# Patient Record
Sex: Male | Born: 1955 | Race: White | Hispanic: No | Marital: Married
Health system: Southern US, Academic
[De-identification: ages and names within clinical notes are randomized; demographics above are authoritative.]

## PROBLEM LIST (undated history)

## (undated) DIAGNOSIS — R0683 Snoring: Secondary | ICD-10-CM

## (undated) DIAGNOSIS — C189 Malignant neoplasm of colon, unspecified: Secondary | ICD-10-CM

## (undated) DIAGNOSIS — K219 Gastro-esophageal reflux disease without esophagitis: Secondary | ICD-10-CM

## (undated) DIAGNOSIS — N529 Male erectile dysfunction, unspecified: Secondary | ICD-10-CM

## (undated) DIAGNOSIS — B49 Unspecified mycosis: Secondary | ICD-10-CM

## (undated) DIAGNOSIS — E119 Type 2 diabetes mellitus without complications: Secondary | ICD-10-CM

## (undated) DIAGNOSIS — M109 Gout, unspecified: Secondary | ICD-10-CM

## (undated) DIAGNOSIS — E559 Vitamin D deficiency, unspecified: Secondary | ICD-10-CM

## (undated) DIAGNOSIS — E785 Hyperlipidemia, unspecified: Secondary | ICD-10-CM

## (undated) DIAGNOSIS — G4700 Insomnia, unspecified: Secondary | ICD-10-CM

## (undated) DIAGNOSIS — F1010 Alcohol abuse, uncomplicated: Secondary | ICD-10-CM

## (undated) DIAGNOSIS — D649 Anemia, unspecified: Secondary | ICD-10-CM

## (undated) DIAGNOSIS — J449 Chronic obstructive pulmonary disease, unspecified: Secondary | ICD-10-CM

## (undated) DIAGNOSIS — E669 Obesity, unspecified: Secondary | ICD-10-CM

## (undated) DIAGNOSIS — I509 Heart failure, unspecified: Secondary | ICD-10-CM

## (undated) DIAGNOSIS — M1990 Unspecified osteoarthritis, unspecified site: Secondary | ICD-10-CM

## (undated) DIAGNOSIS — G629 Polyneuropathy, unspecified: Secondary | ICD-10-CM

## (undated) DIAGNOSIS — D696 Thrombocytopenia, unspecified: Secondary | ICD-10-CM

## (undated) DIAGNOSIS — F419 Anxiety disorder, unspecified: Secondary | ICD-10-CM

## (undated) DIAGNOSIS — C4491 Basal cell carcinoma of skin, unspecified: Secondary | ICD-10-CM

## (undated) HISTORY — PX: BOWEL RESECTION: SHX1257

## (undated) HISTORY — DX: Gastro-esophageal reflux disease without esophagitis: K21.9

## (undated) HISTORY — DX: Snoring: R06.83

## (undated) HISTORY — DX: Type 2 diabetes mellitus without complications: E11.9

## (undated) HISTORY — DX: Insomnia, unspecified: G47.00

## (undated) HISTORY — DX: Anemia, unspecified: D64.9

## (undated) HISTORY — DX: Gout, unspecified: M10.9

## (undated) HISTORY — DX: Polyneuropathy, unspecified: G62.9

## (undated) HISTORY — DX: Unspecified mycosis: B49

## (undated) HISTORY — DX: Alcohol abuse, uncomplicated: F10.10

## (undated) HISTORY — DX: Vitamin D deficiency, unspecified: E55.9

## (undated) HISTORY — DX: Obesity, unspecified: E66.9

## (undated) HISTORY — DX: Anxiety disorder, unspecified: F41.9

## (undated) HISTORY — DX: Unspecified osteoarthritis, unspecified site: M19.90

## (undated) HISTORY — DX: Malignant neoplasm of colon, unspecified: C18.9

## (undated) HISTORY — PX: COLONOSCOPY: SHX174

## (undated) HISTORY — DX: Heart failure, unspecified: I50.9

## (undated) HISTORY — DX: Thrombocytopenia, unspecified: D69.6

## (undated) HISTORY — DX: Basal cell carcinoma of skin, unspecified: C44.91

## (undated) HISTORY — DX: Hyperlipidemia, unspecified: E78.5

## (undated) HISTORY — DX: Male erectile dysfunction, unspecified: N52.9

## (undated) HISTORY — DX: Chronic obstructive pulmonary disease, unspecified: J44.9

---
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
## 2007-05-31 ENCOUNTER — Emergency Department: Payer: Self-pay | Admitting: Internal Medicine

## 2008-11-15 ENCOUNTER — Ambulatory Visit: Payer: Self-pay | Admitting: Pain Medicine

## 2009-10-30 ENCOUNTER — Ambulatory Visit: Payer: Self-pay | Admitting: Unknown Physician Specialty

## 2009-11-04 ENCOUNTER — Ambulatory Visit: Payer: Self-pay | Admitting: Unknown Physician Specialty

## 2009-11-18 ENCOUNTER — Ambulatory Visit: Payer: Self-pay

## 2009-11-22 ENCOUNTER — Ambulatory Visit: Payer: Self-pay | Admitting: Specialist

## 2009-11-29 ENCOUNTER — Ambulatory Visit: Payer: Self-pay | Admitting: Internal Medicine

## 2009-12-18 ENCOUNTER — Ambulatory Visit: Payer: Self-pay | Admitting: Surgery

## 2009-12-20 ENCOUNTER — Ambulatory Visit: Payer: Self-pay | Admitting: Surgery

## 2009-12-22 ENCOUNTER — Ambulatory Visit: Payer: Self-pay | Admitting: Radiation Oncology

## 2009-12-22 HISTORY — PX: COLECTOMY: SHX59

## 2009-12-26 ENCOUNTER — Inpatient Hospital Stay: Payer: Self-pay | Admitting: Surgery

## 2010-01-10 ENCOUNTER — Ambulatory Visit: Payer: Self-pay | Admitting: Radiation Oncology

## 2010-01-22 ENCOUNTER — Ambulatory Visit: Payer: Self-pay | Admitting: Radiation Oncology

## 2010-02-07 ENCOUNTER — Emergency Department: Payer: Self-pay | Admitting: Emergency Medicine

## 2010-05-27 IMAGING — CR DG ABDOMEN 3V
1 series · 6 of 6 positions shown · non-contrast
Comparison: none

REASON FOR EXAM: post op abdominal distension.
COMMENTS:

[Series 1: view not recorded · 0.17mm/px · 6 of 6 slices shown]
[im 1/6]
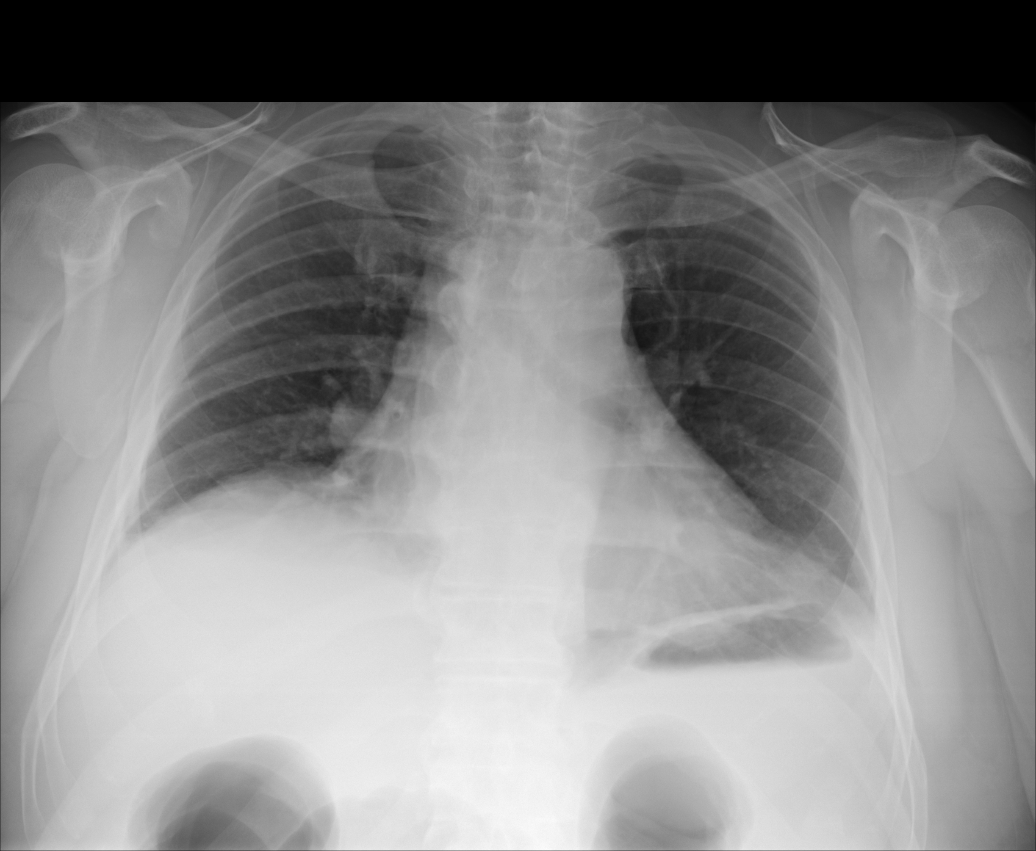
[im 2/6]
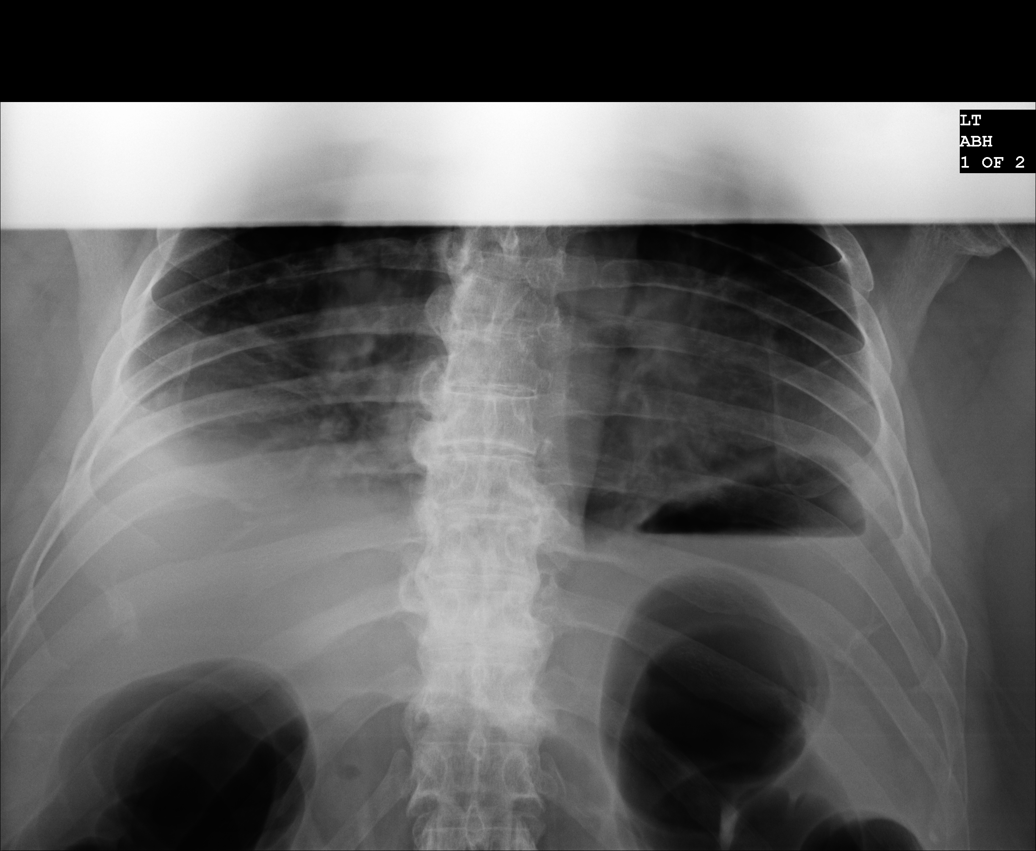
[im 3/6]
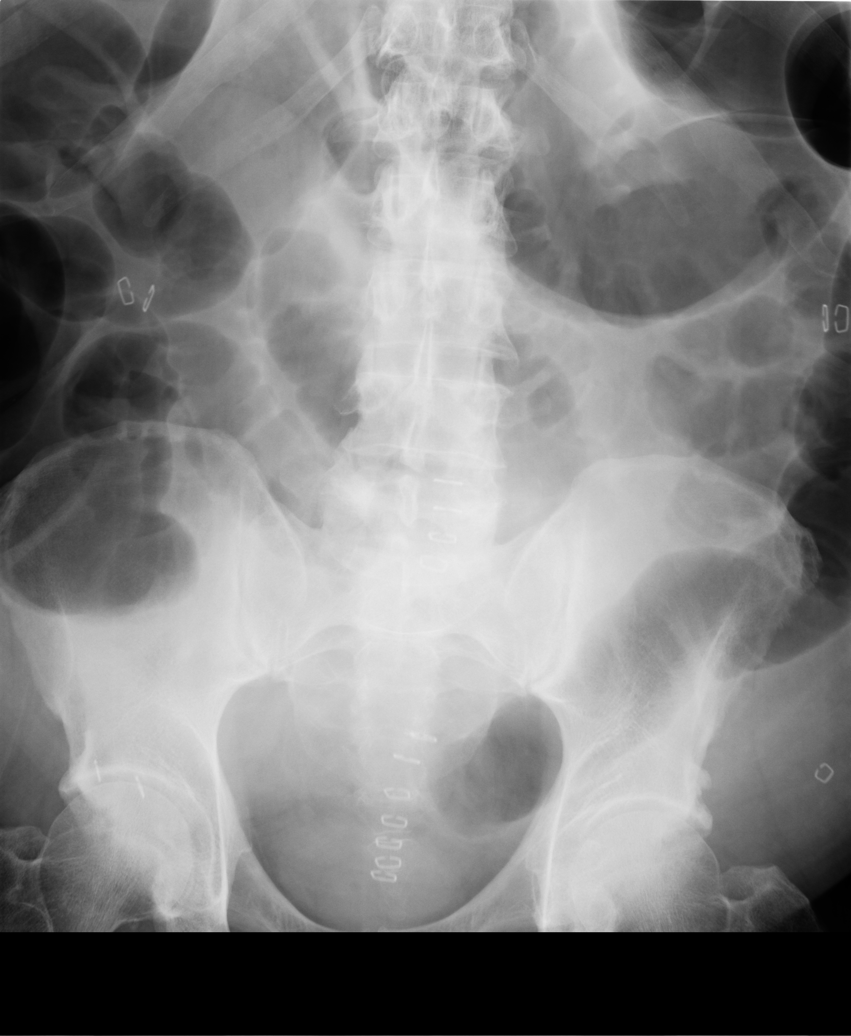
[im 4/6]
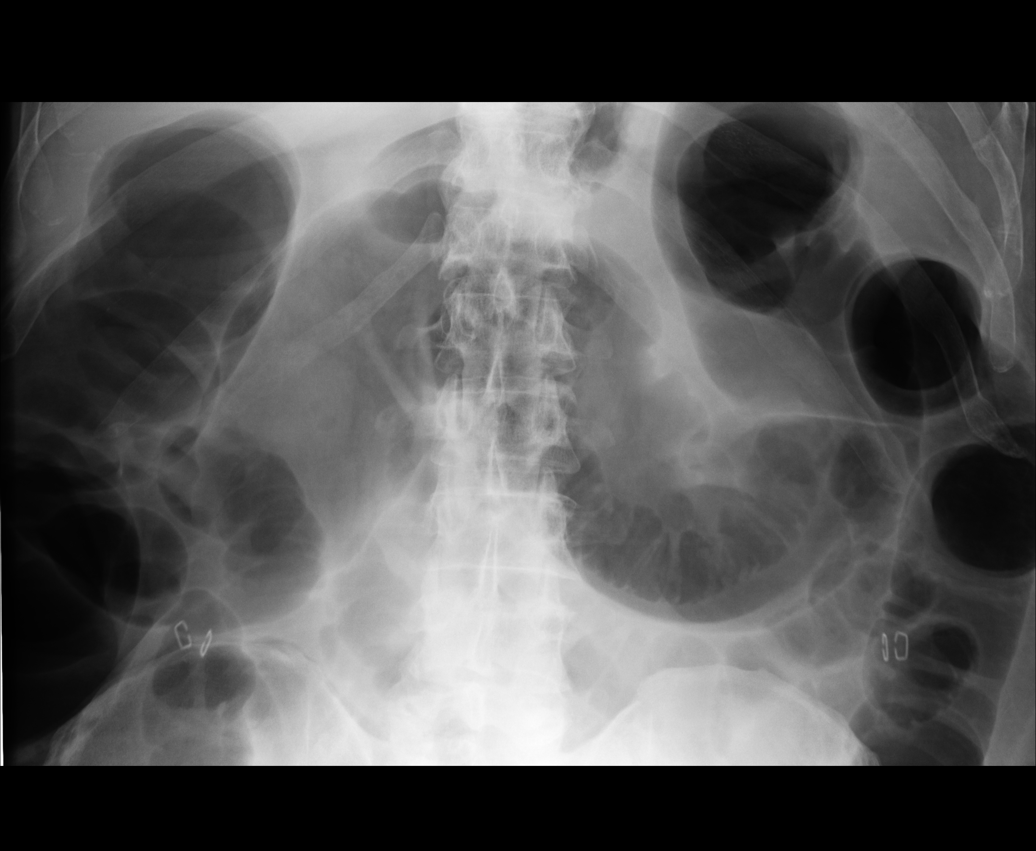
[im 5/6]
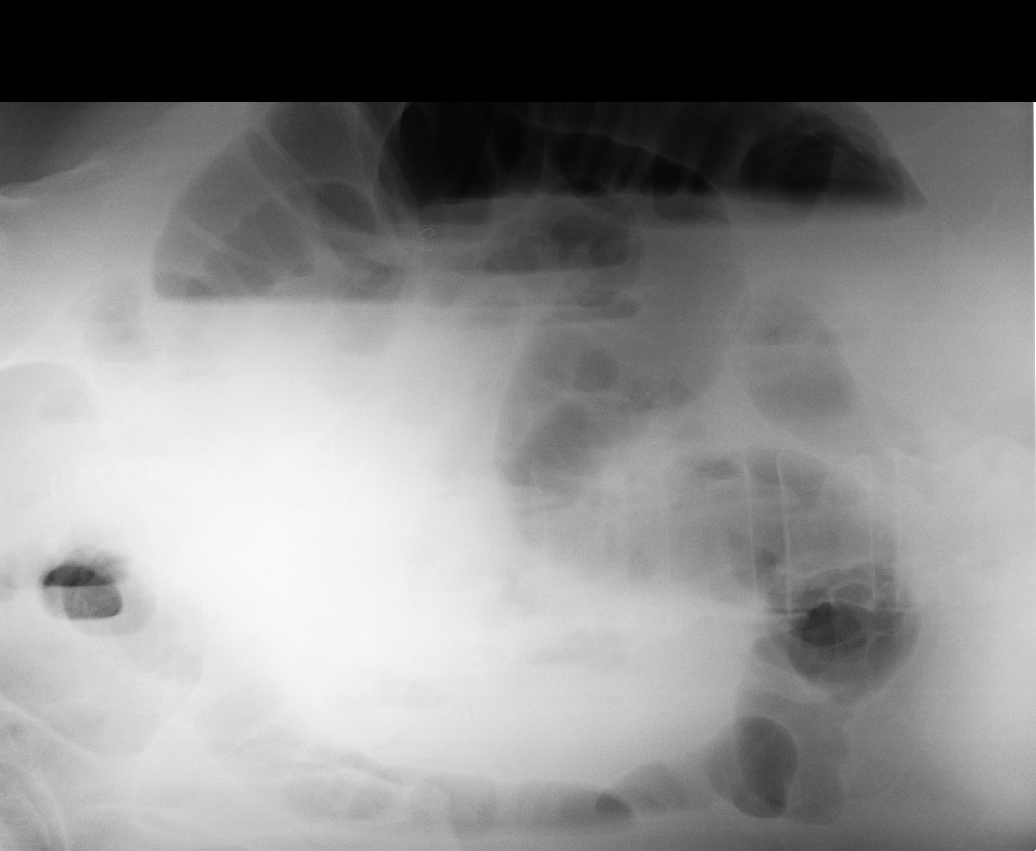
[im 6/6]
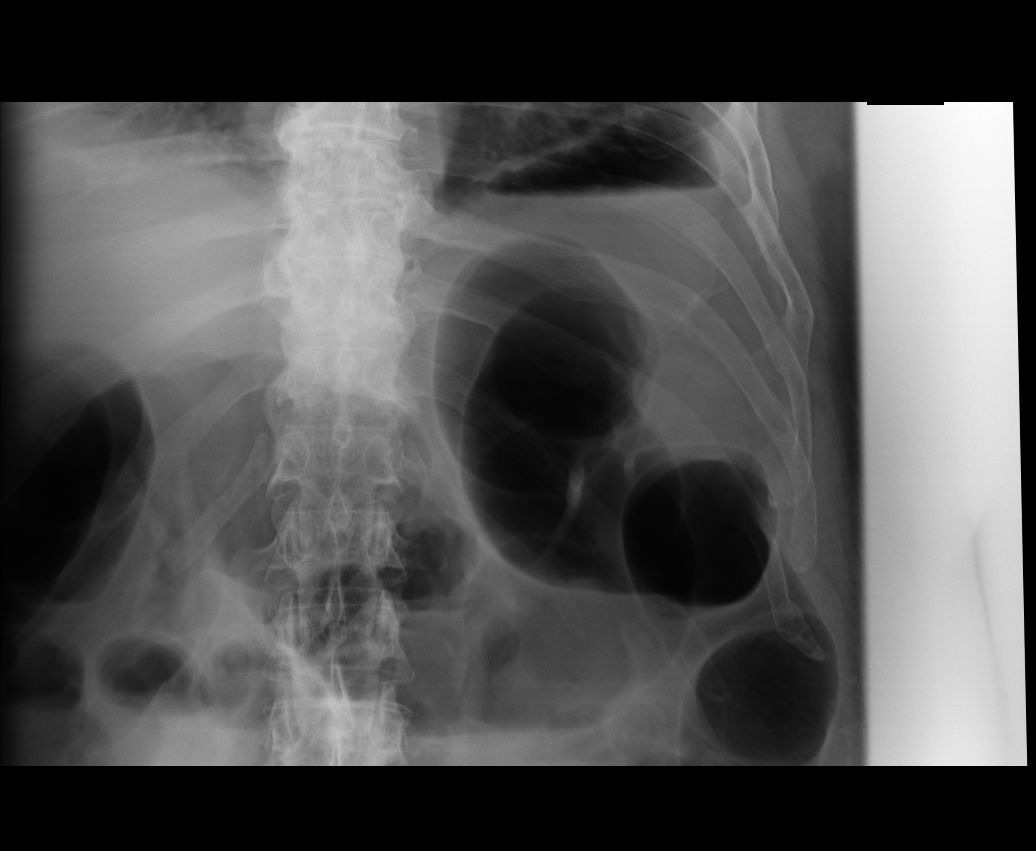

[6 of 6 positions shown; findings below may reference images not displayed]

PROCEDURE:     DXR - DXR ABDOMEN 3-WAY (INCL PA CXR)  - December 29, 2009  [DATE]

RESULT:     A frontal view of the chest demonstrates no evidence of focal
infiltrates, effusions or edema.

Multiple air-filled distended and dilated loops of small bowel are
appreciated as well as an air-fluid level. Air is also appreciated within
distended loops of colon. These findings have the appearance of an ileus and
surveillance evaluation is recommended. Note a distal bowel obstruction
partial or early cannot be completely excluded if clinically appropriate.
IMPRESSION: Bowel gas pattern which has the appearance of an ileus
particularly considering the patient's history of recent surgery.
Surveillance evaluation recommended.

## 2012-04-11 ENCOUNTER — Ambulatory Visit: Payer: Self-pay | Admitting: Unknown Physician Specialty

## 2016-01-07 ENCOUNTER — Inpatient Hospital Stay: Payer: Medicaid Other | Admitting: Internal Medicine

## 2016-01-14 ENCOUNTER — Encounter: Payer: Self-pay | Admitting: *Deleted

## 2016-01-14 ENCOUNTER — Inpatient Hospital Stay: Payer: Medicaid Other | Attending: Internal Medicine | Admitting: Internal Medicine

## 2016-01-15 ENCOUNTER — Other Ambulatory Visit: Payer: Self-pay | Admitting: Internal Medicine

## 2017-04-25 ENCOUNTER — Emergency Department: Payer: Medicaid Other

## 2017-04-25 ENCOUNTER — Encounter: Payer: Self-pay | Admitting: Emergency Medicine

## 2017-04-25 ENCOUNTER — Emergency Department
Admission: EM | Admit: 2017-04-25 | Discharge: 2017-04-25 | Disposition: A | Discharge status: Home / Self Care | Payer: Medicaid Other | Attending: Emergency Medicine | Admitting: Emergency Medicine

## 2017-04-25 DIAGNOSIS — R2240 Localized swelling, mass and lump, unspecified lower limb: Secondary | ICD-10-CM | POA: Diagnosis present

## 2017-04-25 DIAGNOSIS — K709 Alcoholic liver disease, unspecified: Secondary | ICD-10-CM | POA: Diagnosis not present

## 2017-04-25 DIAGNOSIS — Z87891 Personal history of nicotine dependence: Secondary | ICD-10-CM | POA: Insufficient documentation

## 2017-04-25 DIAGNOSIS — E119 Type 2 diabetes mellitus without complications: Secondary | ICD-10-CM | POA: Insufficient documentation

## 2017-04-25 DIAGNOSIS — J449 Chronic obstructive pulmonary disease, unspecified: Secondary | ICD-10-CM | POA: Insufficient documentation

## 2017-04-25 DIAGNOSIS — I509 Heart failure, unspecified: Secondary | ICD-10-CM | POA: Insufficient documentation

## 2017-04-25 LAB — BASIC METABOLIC PANEL
Anion gap: 5 (ref 5–15)
BUN: 10 mg/dL (ref 6–20)
CHLORIDE: 109 mmol/L (ref 101–111)
CO2: 23 mmol/L (ref 22–32)
CREATININE: 0.62 mg/dL (ref 0.61–1.24)
Calcium: 8.5 mg/dL — ABNORMAL LOW (ref 8.9–10.3)
GFR calc Af Amer: >60 mL/min (ref >60)
GLUCOSE: 149 mg/dL — AB (ref 65–99)
POTASSIUM: 3.7 mmol/L (ref 3.5–5.1)
Sodium: 137 mmol/L (ref 135–145)

## 2017-04-25 LAB — HEPATIC FUNCTION PANEL
ALK PHOS: 63 U/L (ref 38–126)
ALT: 28 U/L (ref 17–63)
AST: 66 U/L — ABNORMAL HIGH (ref 15–41)
Albumin: 2.6 g/dL — ABNORMAL LOW (ref 3.5–5.0)
BILIRUBIN INDIRECT: 2.5 mg/dL — AB (ref 0.3–0.9)
BILIRUBIN TOTAL: 3.8 mg/dL — AB (ref 0.3–1.2)
Bilirubin, Direct: 1.3 mg/dL — ABNORMAL HIGH (ref 0.1–0.5)
Total Protein: 7 g/dL (ref 6.5–8.1)

## 2017-04-25 LAB — PROTIME-INR
INR: 1.62
PROTHROMBIN TIME: 19.1 s — AB (ref 11.4–15.2)

## 2017-04-25 LAB — CBC
HEMATOCRIT: 31.5 % — AB (ref 40.0–52.0)
Hemoglobin: 11 g/dL — ABNORMAL LOW (ref 13.0–18.0)
MCH: 35.8 pg — ABNORMAL HIGH (ref 26.0–34.0)
MCHC: 35 g/dL (ref 32.0–36.0)
MCV: 102.4 fL — AB (ref 80.0–100.0)
Platelets: 81 10*3/uL — ABNORMAL LOW (ref 150–440)
RBC: 3.08 MIL/uL — ABNORMAL LOW (ref 4.40–5.90)
RDW: 15.8 % — AB (ref 11.5–14.5)
WBC: 4.2 10*3/uL (ref 3.8–10.6)

## 2017-04-25 LAB — AMMONIA: Ammonia: 69 umol/L — ABNORMAL HIGH (ref 9–35)

## 2017-04-25 LAB — TROPONIN I: Troponin I: <0.03 ng/mL (ref <0.03)

## 2017-04-25 NOTE — Discharge Instructions (Signed)
as we discussed please decrease your alcohol intake over the next 10 days with a goal of stopping alcohol altogether within the next 2 weeks. Please follow-up with GI medicine by calling the number provided. Please follow-up with [facility] hepatology by calling the number provided on Tuesday for the next available appointment. Return to the emergency department for any trouble breathing, abdominal pain, or any other symptom personally concerning to yourself.  [facility] hepatology:  [phone]
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
## 2017-04-25 NOTE — ED Triage Notes (Signed)
C/O leg pain and swelling x 2 months.  Also some black areas to bilateral great toe nails x 6 weeks.

## 2017-04-29 ENCOUNTER — Telehealth: Payer: Self-pay | Admitting: Emergency Medicine

## 2017-04-29 NOTE — Telephone Encounter (Signed)
Daughter called asking for referral to [facility] as that was where ED physician instructed to follow up.  I faxed referral to hepatology clinic.  I had also called [facility].  They said he has appt on the 10th and they gave me their office npi to make them the referring practice.

## 2017-06-08 ENCOUNTER — Ambulatory Visit: Payer: Medicaid Other | Admitting: Gastroenterology

## 2017-06-15 ENCOUNTER — Inpatient Hospital Stay
Admission: EM | Admit: 2017-06-15 | Discharge: 2017-06-18 | Disposition: A | Discharge status: Home Health | Payer: MEDICAID | Source: Intra-hospital

## 2017-06-15 DIAGNOSIS — K7031 Alcoholic cirrhosis of liver with ascites: Secondary | ICD-10-CM | POA: Insufficient documentation

## 2017-06-15 DIAGNOSIS — F1020 Alcohol dependence, uncomplicated: Secondary | ICD-10-CM | POA: Insufficient documentation

## 2017-06-15 DIAGNOSIS — K7682 Hepatic encephalopathy: Secondary | ICD-10-CM | POA: Insufficient documentation

## 2017-06-15 DIAGNOSIS — K7290 Hepatic failure, unspecified without coma: Secondary | ICD-10-CM | POA: Insufficient documentation

## 2017-06-15 DIAGNOSIS — D539 Nutritional anemia, unspecified: Secondary | ICD-10-CM | POA: Insufficient documentation

## 2017-06-15 DIAGNOSIS — R601 Generalized edema: Principal | ICD-10-CM

## 2017-06-16 DIAGNOSIS — K7290 Hepatic failure, unspecified without coma: Secondary | ICD-10-CM | POA: Insufficient documentation

## 2017-06-16 DIAGNOSIS — R601 Generalized edema: Principal | ICD-10-CM

## 2017-06-17 DIAGNOSIS — R601 Generalized edema: Principal | ICD-10-CM

## 2017-06-18 MED ORDER — LACTULOSE 10 GRAM/15 ML ORAL SOLUTION
Freq: Three times a day (TID) | ORAL | 0 refills | 0.00000 days | Status: CP
Start: 2017-06-18 — End: 2017-07-03

## 2017-06-19 MED ORDER — SPIRONOLACTONE 100 MG TABLET
ORAL_TABLET | Freq: Every day | ORAL | 0 refills | 0.00000 days | Status: CP
Start: 2017-06-19 — End: 2017-07-19

## 2017-06-19 MED ORDER — FUROSEMIDE 20 MG TABLET
ORAL_TABLET | Freq: Every day | ORAL | 0 refills | 0.00000 days | Status: CP
Start: 2017-06-19 — End: 2017-07-03

## 2017-06-24 ENCOUNTER — Ambulatory Visit
Admission: RE | Admit: 2017-06-24 | Discharge: 2017-06-24 | Disposition: A | Discharge status: Home / Self Care | Payer: MEDICAID

## 2017-06-24 DIAGNOSIS — K7031 Alcoholic cirrhosis of liver with ascites: Secondary | ICD-10-CM

## 2017-06-24 DIAGNOSIS — K709 Alcoholic liver disease, unspecified: Principal | ICD-10-CM

## 2017-06-24 DIAGNOSIS — K7290 Hepatic failure, unspecified without coma: Secondary | ICD-10-CM

## 2017-06-24 LAB — CBC W/ AUTO DIFF
BASOPHILS ABSOLUTE COUNT: 0.1 10*9/L (ref 0.0–0.1)
EOSINOPHILS ABSOLUTE COUNT: 0.2 10*9/L (ref 0.0–0.4)
HEMATOCRIT: 30.4 % — ABNORMAL LOW (ref 41.0–53.0)
HEMOGLOBIN: 9.9 g/dL — ABNORMAL LOW (ref 13.5–17.5)
LARGE UNSTAINED CELLS: 4 % (ref 0–4)
MEAN CORPUSCULAR HEMOGLOBIN CONC: 32.5 g/dL (ref 31.0–37.0)
MEAN CORPUSCULAR HEMOGLOBIN: 34 pg (ref 26.0–34.0)
MEAN CORPUSCULAR VOLUME: 104.5 fL — ABNORMAL HIGH (ref 80.0–100.0)
MEAN PLATELET VOLUME: 8.5 fL (ref 7.0–10.0)
NEUTROPHILS ABSOLUTE COUNT: 3.1 10*9/L (ref 2.0–7.5)
PLATELET COUNT: 92 10*9/L — ABNORMAL LOW (ref 150–440)
RED BLOOD CELL COUNT: 2.91 10*12/L — ABNORMAL LOW (ref 4.50–5.90)
RED CELL DISTRIBUTION WIDTH: 18 % — ABNORMAL HIGH (ref 12.0–15.0)
WBC ADJUSTED: 5 10*9/L (ref 4.5–11.0)

## 2017-06-24 LAB — COMPREHENSIVE METABOLIC PANEL
ALBUMIN: 2.7 g/dL — ABNORMAL LOW (ref 3.5–5.0)
ALKALINE PHOSPHATASE: 83 U/L (ref 38–126)
ANION GAP: 7 mmol/L — ABNORMAL LOW (ref 9–15)
AST (SGOT): 65 U/L — ABNORMAL HIGH (ref 19–55)
BILIRUBIN TOTAL: 5.4 mg/dL — ABNORMAL HIGH (ref 0.0–1.2)
BLOOD UREA NITROGEN: 12 mg/dL (ref 7–21)
BUN / CREAT RATIO: 18
CALCIUM: 8.6 mg/dL (ref 8.5–10.2)
CHLORIDE: 106 mmol/L (ref 98–107)
CO2: 26 mmol/L (ref 22.0–30.0)
CREATININE: 0.68 mg/dL — ABNORMAL LOW (ref 0.70–1.30)
EGFR MDRD AF AMER: >=60 mL/min/{1.73_m2} (ref >=60)
EGFR MDRD NON AF AMER: >=60 mL/min/{1.73_m2} (ref >=60)
GLUCOSE RANDOM: 97 mg/dL (ref 65–179)
POTASSIUM: 3.9 mmol/L (ref 3.5–5.0)
PROTEIN TOTAL: 6.6 g/dL (ref 6.5–8.3)
SODIUM: 139 mmol/L (ref 135–145)

## 2017-06-24 LAB — INR: Lab: 2.11

## 2017-06-24 LAB — GAMMA GLUTAMYL TRANSFERASE: Gamma glutamyl transferase:CCnc:Pt:Ser/Plas:Qn:: 36

## 2017-06-24 LAB — ISOPROPANOL BLOOD: Lab: <10

## 2017-06-24 LAB — ETHYLENE GLYCOL: Ethylene glycol:MCnc:Pt:Ser/Plas/Bld:Qn:: <5

## 2017-06-24 LAB — ETHANOL: Ethanol:MCnc:Pt:Ser/Plas:Qn:GC: <10

## 2017-06-24 LAB — METHYL ALCOHOL: Lab: <10

## 2017-06-24 LAB — SMEAR REVIEW

## 2017-06-24 LAB — CO2: Carbon dioxide:SCnc:Pt:Ser/Plas:Qn:: 26

## 2017-06-24 LAB — BASOPHILS ABSOLUTE COUNT: Lab: 0.1

## 2017-06-24 MED ORDER — RIFAXIMIN 550 MG TABLET
ORAL_TABLET | Freq: Two times a day (BID) | ORAL | 11 refills | 0 days | Status: CP
Start: 2017-06-24 — End: ?

## 2017-06-24 NOTE — Unmapped (Signed)
[facility], [place]        Referring Provider:  Carlynn Spry, NP  [address]  [place], [place] [address]     Primary Care Provider:  Babs Bertin, NP        PATIENT PROFILE:        Ray Bell is a 61 y.o. male (DOB: 1956/03/04) who is seen in consultation at the request of Ms. 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 He is decompensated currently, but his symptoms may improve over time.  He understands he can never drink alcohol again and is motivated to seek counseling. He understands this is a liver transplant requirement, even prior to listing.  He has very good family support. He did not take his lactulose today and he's a bit unsteady on his feet, adding Xifaxin to lactulose, may be helpful.   He needs to follow a strict low sodium diet and increase his protein intake.   We discussed long-term cirrhosis care and signs and symptoms of worsening disease.                PLAN:       -This patient was discussed with Dr. Ruffin Frederick  -labs today to monitor liver function  -Add Xifaxin for hepatic encephalopathy. con't lactulose  -Increase Lasix to 60mg  per day  -Low Na + diet, higher protein  -EGD for Variceal screening  -Referral to [facility] for 6 months of required [facility], also needs monthly drug and alcohol screenings, (needs to start, very soon)   -LVP scheduled for 06/28/17  -RTC in 4 weeks, will give 2nd HBV, same day        Cirrhosis Care  EGD: ordered  [facility] screening: 06/16/17 no HCC  Vaccinations: HBV # given, HBV # 2 due 07/18/17  Bone Density:  Last Drink: Sept 2, 2018  Transplant: needs counseling      MELD-Na score: 21 at 06/24/2017  9:42 AM  MELD score: 21 at 06/24/2017  9:42 AM  Calculated from:  Serum Creatinine: 0.68 mg/dL (Rounded to 1) at 60/11/5407  9:42 AM  Serum Sodium: 139 mmol/L (Rounded to 137) at 06/24/2017  9:42 AM  Total Bilirubin: 5.4 mg/dL at 81/08/9145  8:29 AM  INR(ratio): 2.11 at 06/24/2017  9:42 AM  Age: 3 years CHIEF COMPLAINT: I'm here for my liver        HISTORY OF PRESENT ILLNESS: This is a 61 y.o. year old male with This is a 61 y.o. male with PMHx of T2DM, HTN, HLD, CAD, HFrEF, COPD, h/o colon cancer s/p resection 2011, anxiety, and recent diagnosis of alcoholic cirrhosis who presents for hospitial follow-up. He was first noted to have liver disease in early September 2018 and was referred to [facility],  But was admitted last week for increased confusion, ascites and Le swelling. He underwent US in house which did not show HCC or PVT. LVP was done, no SBP and he was placed on low dose diuretics. In house work-up showed immunity to HAV and he stared HBV vaccine series.   Belly fluid is back today, LE Edema improve. He is trying to eat a low sodium diet. He is tolerating lactulose, but did not take this am. He's having 4 BMs per day. ??  ??  He previously drank heavily - typically twelve 12 oz beers per day although could have two 12 packs on a particularly  heavy day of drinking. He has not had any alcohol for the past 8 weeks per patient and daughter.  He has not had any cravings for alcohol and denies any current signs/symptoms of withdrawal although has had shakes in the past but no DTs or seizures. Per patient, he quit for 4 years at one point but then went back to drinking because it was a habit. He has had 2 DUIs as a result of his drinking. Was seen at [facility] ED 04/25/17 for abdominal swelling/lower extremity edema which had been present x 2 months at that time. At that point, patient admitted to drinking 6-8 beers per day. He reports total alcohol abstinence since then.   ??  He denies any family history of liver disease or HCC. No history of blood transfusions, piercings. Does have a tattoo from many years ago. Denies history of intravenous or intranasal drug use. Current weight is his heaviest weight of his life. Risk factors for NAFLD include history of T2DM (not on insulin), HTN, HLD, CAD, overweight.  ??   REVIEW OF SYSTEMS:     The balance of 12 systems reviewed is negative except as noted in the HPI.     PAST MEDICAL HISTORY:    Past Medical History:   Diagnosis Date   ??? Colon cancer (CMS-HCC)    ??? Diabetes mellitus (CMS-HCC)    ??? Hypertension    ??? Liver disease due to alcohol (CMS-HCC)        PAST SURGICAL HISTORY:    Past Surgical History:   Procedure Laterality Date   ??? COLON SURGERY      colectomy in 2011       MEDICATIONS:      Current Outpatient Prescriptions:   ???  albuterol (PROAIR HFA) 90 mcg/actuation inhaler, Inhale 2 puffs every four (4) hours as needed for wheezing., Disp: , Rfl:   ???  amitriptyline (ELAVIL) 100 MG tablet, Take 100 mg by mouth nightly., Disp: , Rfl:   ???  citalopram (CELEXA) 40 MG tablet, Take 40 mg by mouth daily., Disp: , Rfl:   ???  fluticasone-salmeterol (ADVAIR DISKUS) 100-50 mcg/dose diskus, Inhale 1 puff Two (2) times a day., Disp: , Rfl:   ???  furosemide (LASIX) 20 MG tablet, Take 1 tablet (20 mg total) by mouth daily., Disp: 30 tablet, Rfl: 0  ???  glipiZIDE (GLUCOTROL XL) 5 MG 24 hr tablet, Take 5 mg by mouth daily., Disp: , Rfl:   ???  lactulose (CHRONULAC) 10 gram/15 mL solution, Take 30 mL (20 g total) by mouth Three (3) times a day. Can hold doses if having more than 4 bowel movements per day., Disp: 2700 mL, Rfl: 0  ???  metFORMIN (GLUCOPHAGE) 1000 MG tablet, Take 1,000 mg by mouth 2 (two) times a day with meals., Disp: , Rfl:   ???  pantoprazole (PROTONIX) 40 MG tablet, Take 40 mg by mouth daily., Disp: , Rfl:   ???  spironolactone (ALDACTONE) 100 MG tablet, Take 1 tablet (100 mg total) by mouth daily., Disp: 30 tablet, Rfl: 0  ???  traZODone (DESYREL) 150 MG tablet, Take 150 mg by mouth nightly., Disp: , Rfl:   ???  aspirin (ECOTRIN) 81 MG tablet, Take 81 mg by mouth daily., Disp: , Rfl:   ???  atorvastatin (LIPITOR) 40 MG tablet, Take 40 mg by mouth daily., Disp: , Rfl:   ???  colchicine 0.6 mg tablet, Take 0.6 mg by mouth daily., Disp: , Rfl:   ???  hydrocortisone 2.5 % lotion, Apply topically Two (2) times a day., Disp: , Rfl:   ???  ketoconazole (NIZORAL) 2 % shampoo, Apply topically once a week., Disp: , Rfl:     ALLERGIES:    Venlafaxine    SOCIAL HISTORY:    Social History     Social History   ??? Marital status: Married     Spouse name: N/A   ??? Number of children: N/A   ??? Years of education: N/A     Social History Main Topics   ??? Smoking status: Never Smoker   ??? Smokeless tobacco: Never Used   ??? Alcohol use 4.8 oz/week     8 Cans of beer per week      Comment: Quit 04/25/2017; history of EtOH use disorder, drank 6 pack per day   ??? Drug use: No   ??? Sexual activity: Not Currently     Other Topics Concern   ??? None     Social History Narrative   ??? None       FAMILY HISTORY:    family history is not on file.      VITAL SIGNS:    BP 92/53  - Pulse 76  - Temp 36.4 ??C (97.5 ??F) (Oral)  - Wt 92.7 kg (204 lb 6.4 oz)  - SpO2 99%  - BMI 32.99 kg/m??   Body mass index is 32.99 kg/m??.    PHYSICAL EXAM:    Normal comprehensive exam:      Constitutional:   Alert, oriented x 3, no acute distress, chronically ill appearing   Mental Status:   Thought organized, appropriate affect, normal fluent speech.   HEENT:   PEERL, conjunctiva clear, anicteric, oropharynx clear, neck supple, no LAD.   Respiratory: Clear to auscultation, and percussion to the bases, unlabored breathing.     Cardiac: Regular rate and rhythm normal S1 and S2, no murmur.      Abdomen: Soft, distended with tense ascites, non-tender.     Perianal/Rectal Exam Not performed.     Extremities:   Trace ankle edema, well perfused.   Musculoskeletal: No joint swelling or tenderness noted, no deformities.     Skin: No rashes, jaundice or skin lesions noted.     Neuro: No focal deficits. +asterixis         DIAGNOSTIC STUDIES:  I have reviewed all pertinent diagnostic studies, including:    GI Procedures:  EGD ordered    Radiographic studies:  [place] 06/16/17  ??      Impression     --Heterogeneous hepatic echotexture with no focal masses. MRI is more sensitive for small hepatic masses within a heterogeneous liver.  --Large amount of ascites.    Please see below for data measurements:  Liver: 12.4 cm  Gallbladder wall: 3.3 mm  Right kidney length: 11.5 cm  Left kidney length: 12.9 cm  Spleen: 13.1 cm       Laboratory results:    Results for orders placed or performed in visit on 06/24/17   Comprehensive metabolic panel   Result Value Ref Range    Sodium 139 135 - 145 mmol/L    Potassium 3.9 3.5 - 5.0 mmol/L    Chloride 106 98 - 107 mmol/L    CO2 26.0 22.0 - 30.0 mmol/L    BUN 12 7 - 21 mg/dL    Creatinine 1.61 (L) 0.70 - 1.30 mg/dL    BUN/Creatinine Ratio 18     EGFR MDRD Non Af Amer >=60 >=60  mL/min/1.55m2    EGFR MDRD Af Amer >=60 >=60 mL/min/1.45m2    Anion Gap 7 (L) 9 - 15 mmol/L    Glucose 97 65 - 179 mg/dL    Calcium 8.6 8.5 - 16.1 mg/dL    Albumin 2.7 (L) 3.5 - 5.0 g/dL    Total Protein 6.6 6.5 - 8.3 g/dL    Total Bilirubin 5.4 (H) 0.0 - 1.2 mg/dL    AST 65 (H) 19 - 55 U/L    ALT 48 19 - 72 U/L    Alkaline Phosphatase 83 38 - 126 U/L   Gamma GT (GGT)   Result Value Ref Range    GGT 36 12 - 109 U/L   PT-INR   Result Value Ref Range    PT 24.1 (H) 10.2 - 12.8 sec    INR 2.11    Ethanol   Result Value Ref Range    Alcohol, Ethyl <10.0 Undefined mg/dL   Ethylene glycol   Result Value Ref Range    Ethylene Glycol Level <5 Undefined mg/dL   Alcohol, Isopropanol   Result Value Ref Range    Alcohol, Isopropyl <10 Undefined mg/dL    Acetone <09 Undefined mg/dL   Alcohol, Methyl   Result Value Ref Range    Methyl Alcohol <10 Undefined mg/dL   CBC w/ Differential   Result Value Ref Range    WBC 5.0 4.5 - 11.0 10*9/L    RBC 2.91 (L) 4.50 - 5.90 10*12/L    HGB 9.9 (L) 13.5 - 17.5 g/dL    HCT 60.4 (L) 54.0 - 53.0 %    MCV 104.5 (H) 80.0 - 100.0 fL    MCH 34.0 26.0 - 34.0 pg    MCHC 32.5 31.0 - 37.0 g/dL    RDW 98.1 (H) 19.1 - 15.0 %    MPV 8.5 7.0 - 10.0 fL    Platelet 92 (L) 150 - 440 10*9/L    Absolute Neutrophils 3.1 2.0 - 7.5 10*9/L    Absolute Lymphocytes 0.9 (L) 1.5 - 5.0 10*9/L    Absolute Monocytes 0.6 0.2 - 0.8 10*9/L    Absolute Eosinophils 0.2 0.0 - 0.4 10*9/L    Absolute Basophils 0.1 0.0 - 0.1 10*9/L    Large Unstained Cells 4 0 - 4 %    Macrocytosis Marked (A) Not Present    Anisocytosis Moderate (A) Not Present    Hypochromasia Moderate (A) Not Present

## 2017-06-24 NOTE — Unmapped (Signed)
New Medications: Xifaxin 550mg  BID (sent to [facility] pharmacy, will be sent to your house) This meds helps with ammonia levels/ confusion in addition to the lactulose    Upcoming tests: Upper Endoscopy     Worrisome Signs and need for ER visit : fever greater than 100, black stools, vomiting blood or dark material, worsening confusion.       Please follow a 2000mg  sodium restricted diet (see hand out)   Increase Protein intake 60-100Gs per day (eggs, Bean, meat, chicken, yogurt)       It is OK to take up to 2000mg  of acetaminophen (tylenol), please be cautious of combination products. Do not take ibuprofen, naproxen, aspirin, advil, aleve, motrin, Excedrin or headache powders. If you have questions about over-the-counter medications please contact our nurse,  at [phone].      Please Return to clinic in 4 weeks  If you have not received a notice about a follow-up appointment please call the Liver Center at [phone]

## 2017-06-25 ENCOUNTER — Inpatient Hospital Stay
Admission: EM | Admit: 2017-06-25 | Discharge: 2017-07-03 | Disposition: A | Discharge status: Home Health | Payer: MEDICAID | Source: Intra-hospital

## 2017-06-25 DIAGNOSIS — K7290 Hepatic failure, unspecified without coma: Principal | ICD-10-CM

## 2017-06-25 LAB — COMPREHENSIVE METABOLIC PANEL
ALBUMIN: 2.6 g/dL — ABNORMAL LOW (ref 3.5–5.0)
ALKALINE PHOSPHATASE: 81 U/L (ref 38–126)
ALT (SGPT): 51 U/L (ref 19–72)
ANION GAP: 5 mmol/L — ABNORMAL LOW (ref 9–15)
AST (SGOT): 55 U/L (ref 19–55)
BILIRUBIN TOTAL: 5.9 mg/dL — ABNORMAL HIGH (ref 0.0–1.2)
BLOOD UREA NITROGEN: 13 mg/dL (ref 7–21)
BUN / CREAT RATIO: 20
CALCIUM: 8.5 mg/dL (ref 8.5–10.2)
CHLORIDE: 107 mmol/L (ref 98–107)
CO2: 25 mmol/L (ref 22.0–30.0)
CREATININE: 0.66 mg/dL — ABNORMAL LOW (ref 0.70–1.30)
EGFR MDRD AF AMER: >=60 mL/min/{1.73_m2} (ref >=60)
EGFR MDRD NON AF AMER: >=60 mL/min/{1.73_m2} (ref >=60)
GLUCOSE RANDOM: 131 mg/dL (ref 65–179)
POTASSIUM: 3.6 mmol/L (ref 3.5–5.0)
SODIUM: 137 mmol/L (ref 135–145)

## 2017-06-25 LAB — BLOOD UREA NITROGEN: Urea nitrogen:MCnc:Pt:Ser/Plas:Qn:: 13

## 2017-06-25 LAB — CBC W/ AUTO DIFF
BASOPHILS ABSOLUTE COUNT: 0.1 10*9/L (ref 0.0–0.1)
HEMOGLOBIN: 9.5 g/dL — ABNORMAL LOW (ref 13.5–17.5)
LARGE UNSTAINED CELLS: 4 % (ref 0–4)
LYMPHOCYTES ABSOLUTE COUNT: 0.9 10*9/L — ABNORMAL LOW (ref 1.5–5.0)
MEAN CORPUSCULAR HEMOGLOBIN: 33.8 pg (ref 26.0–34.0)
MEAN CORPUSCULAR VOLUME: 104.3 fL — ABNORMAL HIGH (ref 80.0–100.0)
MEAN PLATELET VOLUME: 8.2 fL (ref 7.0–10.0)
MONOCYTES ABSOLUTE COUNT: 0.5 10*9/L (ref 0.2–0.8)
NEUTROPHILS ABSOLUTE COUNT: 3.3 10*9/L (ref 2.0–7.5)
PLATELET COUNT: 91 10*9/L — ABNORMAL LOW (ref 150–440)
RED BLOOD CELL COUNT: 2.8 10*12/L — ABNORMAL LOW (ref 4.50–5.90)
RED CELL DISTRIBUTION WIDTH: 18.1 % — ABNORMAL HIGH (ref 12.0–15.0)
WBC ADJUSTED: 5.2 10*9/L (ref 4.5–11.0)

## 2017-06-25 LAB — INR: Lab: 2.14

## 2017-06-25 LAB — APTT: Coagulation surface induced:Time:Pt:PPP:Qn:Coag: 36.4

## 2017-06-25 LAB — URINALYSIS WITH CULTURE REFLEX
BACTERIA: NONE SEEN /HPF
BLOOD UA: NEGATIVE
GLUCOSE UA: NEGATIVE
LEUKOCYTE ESTERASE UA: NEGATIVE
NITRITE UA: NEGATIVE
PH UA: 7 (ref 5.0–9.0)
PROTEIN UA: NEGATIVE
RBC UA: <1 /HPF (ref <3)
SPECIFIC GRAVITY UA: 1.019 (ref 1.003–1.030)
SQUAMOUS EPITHELIAL: <1 /HPF (ref 0–5)
UROBILINOGEN UA: >=8 — AB

## 2017-06-25 LAB — NEUTROPHILS ABSOLUTE COUNT: Lab: 3.3

## 2017-06-25 LAB — AMMONIA: Ammonia:SCnc:Pt:Plas:Qn:: 123 — ABNORMAL HIGH

## 2017-06-25 LAB — PH UA: Lab: 7

## 2017-06-25 LAB — LACTATE BLOOD VENOUS: Lactate:SCnc:Pt:BldV:Qn:: 2.2 — ABNORMAL HIGH

## 2017-06-26 NOTE — Unmapped (Signed)
PCP: Babs Bertin, NP  Date of Admission: June 25, 2017  Code Status: full code  Emergency Contact: Derrill Memo (daughter), [phone]  ??  Per H & P  ASSESSMENT / PLAN:   Ray Bell is a 61 y.o. male with a past medical history significant for alcoholic cirrhosis, COPD, HTN, T2DM who presents with altered mental status in the setting of not taking his medications properly.   ??  CM spoke with pt  at bedside &  verified all facesheet info including:   Address, phone number, contacts/phone numbers, HH/DME, and insurance.          Care Management  Initial Transition Planning Assessment              General  Care Manager assessed the patient by : In person interview with patient, Medical record review, Discussion with Clinical Care team  Orientation Level: Oriented X4  Who provides care at home?: N/A     Contact/Decision Maker:    Contact Details  Contact Details: Primary Contact  Primary Contact Name: Ples Specter  Primary Contact Relationship: Daughter  Phone #1: [phone]  Secondary Contact Name: Barnie Sopko  Secondary Contact Relationship: Son  Phone #3: [phone]    Advance Directive (Medical Treatment)  Does patient have an advance directive covering medical treatment?: Patient does not have advance directive covering medical treatment.  Reason patient does not have an advance directive covering medical treatment:: Patient does not wish to complete one at this time  Surrogate decision maker appointed:: No  Reason there is not a surrogate decision maker appointed:: Patient does not wish to appoint a surrogate decision maker at this time  Information provided on advance directive:: Yes  Patient requests assistance:: No    Advance Directive (Mental Health Treatment)  Does patient have an advance directive covering mental health treatment?: Patient does not have advance directive covering mental health treatment.  Reason patient does not have an advance directive covering mental health treatment:: Patient does not wish to complete one at this time.  Type of Residence: Mailing Address:  [address]  [place] [address]  Contacts: Accompanied by: Family member  Family Accommodations: daughter went home  Contact Details: Primary Contact  Primary Contact Name: Ples Specter  Primary Contact Relationship: Daughter  Phone #1: [phone]  Secondary Contact Name: Carnie Bruemmer  Secondary Contact Relationship: Son  Phone #3: [phone]  Patient Phone Number: [phone] (home)           Medical Provider(s): Babs Bertin, NP  Reason for Admission: Admitting Diagnosis:  Hepatic encephalopathy (CMS-HCC) [K72.90]  Decompensated hepatic cirrhosis (CMS-HCC) [K72.90]  Past Medical History:   has a past medical history of Colon cancer (CMS-HCC); Diabetes mellitus (CMS-HCC); Hypertension; and 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  Past Surgical History:   has a past surgical history that includes Colon surgery.   Previous admit date: 06/15/2017    Primary Insurance- Payor: MEDICAID [place] / Plan: MEDICAID [place] / Product Type: *No Product type* /   Secondary Insurance ??? None  Prescription Coverage ??? Yes  Preferred Pharmacy - [facility] [place] - [address]  [facility] - [place], [place] - [address]    Transportation home: Private vehicle  Level of function prior to admission: Independent        Patient Information:    Lives with: Alone    Type of Residence: Private  residence        Location/Detail: [address] [place] [address]    Support Systems: Family Members, Friends/Neighbors, Divorced (Ex wife still active part of his life. Patientstates they get on fine together. She visits him & helps as needed)    Responsibilities/Dependents at home?: No    Home Care services in place prior to admission?: Yes  Type of Home Care services in place prior to admission: Home health (specify)  Current Home Care provider (Name/Phone #): [facility]  [phone]    Equipment Currently Used at Home: glucometer       Currently receiving outpatient dialysis?: No       Financial Information:     Patient source of income: SSDI    Need for financial assistance?: No       Discharge Needs Assessment:    Concerns to be Addressed: adjustment to diagnosis/illness    Clinical Risk Factors: Multiple Diagnoses (Chronic)    Barriers to taking medications: No    Prior overnight hospital stay or ED visit in last 90 days: Yes    Readmission Within the Last 30 Days: previous discharge plan unsuccessful         Anticipated Changes Related to Illness: none    Equipment Needed After Discharge: none    Discharge Facility/Level of Care Needs:  (Continue with HH [facility])    Patient at risk for readmission?: Yes    Discharge Plan:    Home with HH. (Continue with HH [facility])      Screen findings are: Care Manager reviewed the plan of the patient's care with the Multidisciplinary Team. No discharge planning needs identified at this time. Care Manager will continue to manage plan and monitor patient's progress with the team.    Expected Discharge Date: 06/29/17    Expected Transfer from Critical Care:  (NA)    Patient and/or family were provided with choice of facilities / services that are available and appropriate to meet post hospital care needs?: Other (Comment) ([facility] open with [facility] per patient. last visit was 06/24/2017)       Initial Assessment complete?: Yes

## 2017-06-26 NOTE — Unmapped (Signed)
Pt resting on stretcher, no acute distress noted. Respirations even and unlabored, call bell and pt belongings within reach. Side rails up x2. Will continue to reassess.

## 2017-06-26 NOTE — Unmapped (Signed)
Family Medicine Inpatient Service    Progress Note    Team: [facility] (pgr [phone])    Hospital Day: 0    ASSESSMENT / PLAN:   Ray Bell is a 61 y.o. male with a past medical history significant for alcoholic cirrhosis, COPD, HTN, T2DM who presents with altered mental status in the setting of not taking his medications for 5 days.    #Altered mental status - Ammonia Elevated 123 - Hepatic Encephalopathy : Most likely 2/2 hepatic encephalopathy due to poor medication adherence. Patient has not taken rifaximin and lactulose medication this past week. Patient's ammonia is elevated and he has asterixis on exam. He has no signs/symptoms of infection or bleeding, his electrolytes are WNL, his CBC is per his baseline, lactate slightly elevated (2.2) but lower than previous admission. His head CT was negative for intracranial bleed and he has no focal neurologic deficits. Will ensure 3-4 BMs today and assess for improvement in mental status.    - Resume home rifaximin 550 mg BID   - Resume home lactulose 20g TID for 3-4 BMs per day   - Hold anticholinergic medications (amitriptyline)   - Delirium precautions, fall precautions  ??  # Ascites - Alcoholic Cirrhosis: MELD-Na 23, Child-Pugh Class C. 90 day mortality 14-15%. MRI 10/25 with moderate ascites. Currently decompensated and non-adherent to medications at home. Patient has a distended, tense abdomen and bilateral lower pitting edema. Sober since diagnosis (04/25/17). Followed by Gastroenterology Vallery Sa, ANP and was seen on 11/1 with the plan to perform LVP on 06/28/17.    - Consider LVP inpatient   - Continue home lactulose, rifaximin (as above)   - Continue home lasix 60 mg daily   - Continue home spironolactone 100 mg daily   - Continue home protonix 40 mg daily (EGD for variceal screening ordered on 11/1)   ??  #Macrocytic anemia: chronic, stable, asymptomatic. Presumably 2/2 liver disease. B12 and folate previously WNL. Baseline Hgb 9-10. #COPD: stable, no home O2 requirement.   - Continue advair 100-50 mcg BID   - Albuterol nebulizer q4h PRN  ??  #T2DM: A1c 5.0% on 06/15/17   - Hold home glipizide XL 5 mg daily and metformin 1g BID   - SSI  ??  #HTN: stable   - Continue lasix and spironolactone as above  ??  #Depression: stable   - Continue citalopram 40 mg daily  ??  #Insomnia:   - Holding amitriptyline 100 mg nightly   - Continue trazodone 150 mg nightly  ??  #Gout:   - continue colchicine 0.6 mg daily    # FEN/GI:  - IVF None  - Check electrolytes as indicated, replete as needed.  - Diet Regular    # PPX:   - DVT: SQ Lovenox    Code Status: Full code    # Dispo: Floor    [ ]  Anticipated Discharge Location: Daughter/ex-wife would like him in ALF/SNF because he is not able to perform ADLs/take medications appropriately. Daughter states patient may refuse.  [ ]  PT/OT/DME: PT/OT ordered  [ ]  CM/SW needs: CM to arrange SNF   [ ]  Meds/Rx:  Not yet prescribed. No special med needs  [ ]  Teaching: None anticipated  [ ]  Follow up appt: Appt needed  [ ]  Excuse letter: None anticipated  [ ]  Transport: Private Needed    SUBJECTIVE:  Interval events: Admitted last night for encephalopathy in the setting of not taking meds/lactulose for several days and need  for safe dispo plan. He was given a dose of lactulose.    This AM he was oriented to person but was unable to say why he was in the hospital. He had no complaints. When asked about having a BM, he says he has not had one yet. He appears confused when discussing when he had his last BM.        REVIEW OF SYSTEMS:  Pertinent positives and negatives as per interval events. A complete review of systems otherwise negative    PHYSICAL EXAM:      Intake/Output Summary (Last 24 hours) at 06/26/17 1259  Last data filed at 06/26/17 1100   Gross per 24 hour   Intake              340 ml   Output              450 ml   Net             -110 ml       Recent Vitals:  Vitals:    06/26/17 1115   BP: 159/72   Pulse: 92   Resp: 18 Temp: 36.6 ??C (97.9 ??F)   SpO2: 97%       GEN: well appearing, lying in bed, NAD  Eyes: PERRL. Scleral icterus. Conjunctiva erythematous. EOMI  HEENT: NCAT, MMM.  Neck: Supple.  Lymphadenopathy: No cervical or supraclavicular LAD.  CV: Regular rate and rhythm. Systolic ejection murmur. No chostochondral tenderness. No cyanosis or clubbing. Cap Refill <3 seconds  Pulm: CTAB. No wheezing, crackles, or rhonchi.  Abd: Distended, tense. Nontender. No guarding, rebound.  Normoactive bowel sounds.    Neuro: A&O to person only. Asterixes present  Ext: 2+ bilateral lower extremity pitting edema up to knees.  Palpable distal pulses.  Skin: Mild erythema and peeling of bilateral LEs. Telangiectasias. Erythema/peeling skin on face.       LABS/ STUDIES:      Recent Labs  Lab Units 06/25/17  2202 06/24/17  0942   WBC 10*9/L 5.2 5.0   HEMOGLOBIN g/dL 9.5* 9.9*   HEMATOCRIT % 29.2* 30.4*   MCV fL 104.3* 104.5*   PLATELET COUNT (1) 10*9/L 91* 92*         Recent Labs  Lab Units 06/25/17  2203 06/24/17  0942   SODIUM mmol/L 137 139   POTASSIUM mmol/L 3.6 3.9   CHLORIDE mmol/L 107 106   CO2 mmol/L 25.0 26.0   BUN mg/dL 13 12   CREATININE mg/dL 1.61* 0.96*   GLUCOSE mg/dL 045 97   CALCIUM mg/dL 8.5 8.6   ALBUMIN g/dL 2.6* 2.7*   ALT U/L 51 48   AST U/L 55 65*   ALK PHOS U/L 81 83   BILIRUBIN TOTAL mg/dL 5.9* 5.4*   PROTEIN TOTAL g/dL 6.5 6.6         Recent Labs  Lab Units 06/25/17  2202 06/24/17  0942   INR  2.14 2.11   PT sec 24.4* 24.1*   APTT sec 36.4  --        No results in the last week    IMAGING:   Ct Head Wo Contrast    Result Date: 06/26/2017  EXAM: Computed tomography, head or brain without contrast material. DATE: 06/25/2017 10:30 PM ACCESSION: [id number] UN DICTATED: 06/25/2017 10:50 PM INTERPRETATION LOCATION: [facility]     CLINICAL INDICATION: 61 years old Male with AMS--      COMPARISON: None     TECHNIQUE: Axial CT  images of the head  from skull base to vertex without contrast.     FINDINGS: There is no midline shift or mass lesion.  There is no evidence of intracranial hemorrhage or acute infarct. There are scattered and confluent hypodense foci within the periventricular and deep white matter.  These are nonspecific but commonly associated with small vessel ischemic changes. No fractures are evident. Partially imaged right maxillary sinus polypoid mucosal thickening.          No acute intracranial process.      MICRO:  Microbiology Results (last day)     ** No results found for the last 24 hours. **          EKG:   Encounter Date: 06/25/17   ECG 12 Lead   Result Value    EKG Systolic BP     EKG Diastolic BP     EKG Ventricular Rate 85    EKG Atrial Rate 85    EKG P-R Interval 132    EKG QRS Duration 84    EKG Q-T Interval 424    EKG QTC Calculation 504    EKG Calculated P Axis 53    EKG Calculated R Axis 4    EKG Calculated T Axis 28    Narrative    NORMAL SINUS RHYTHM  PROLONGED QT  ABNORMAL ECG  WHEN COMPARED WITH ECG OF 15-Jun-2017 17:20,  NO SIGNIFICANT CHANGE WAS FOUND       Feliciana Rossetti, MS4    June 26, 2017 12:59 PM    I attest that I have reviewed the student note and that the components of the history of the present illness, the physical exam, and the assessment and plan documented were performed by me or were performed in my presence by the student where I verified the documentation and performed (or re-performed) the exam and medical decision making.     Wilmon Pali, MD PGY1

## 2017-06-26 NOTE — Unmapped (Signed)
Problem: Patient Care Overview  Goal: Plan of Care Review  Outcome: Progressing   06/26/17 0518   OTHER   Plan of Care Reviewed With patient   Plan of Care Review   Progress improving     Oriented x 2. Calm and cooperative, No bowel movements since admission.Appetite good. Vs wnl. Bed alarm and Falls Precautions initiated.   Goal: Individualization and Mutuality  Outcome: Progressing   06/26/17 0518   Individualization   Patient Specific Goals (Include Timeframe) Return to baseline cognitive function prior to discharge       Problem: Fall Risk (Adult)  Goal: Identify Related Risk Factors and Signs and Symptoms  Related risk factors and signs and symptoms are identified upon initiation of Human Response Clinical Practice Guideline (CPG).   Outcome: Progressing   06/26/17 0518   Fall Risk (Adult)   Related Risk Factors (Fall Risk) confusion/agitation;fatigue/slow reaction;environment unfamiliar   Signs and Symptoms (Fall Risk) presence of risk factors     Goal: Absence of Fall  Patient will demonstrate the desired outcomes by discharge/transition of care.   Outcome: Progressing   06/26/17 0518   Fall Risk (Adult)   Absence of Fall making progress toward outcome       Problem: Self-Care Deficit (Adult,Obstetrics,Pediatric)  Goal: Identify Related Risk Factors and Signs and Symptoms  Related risk factors and signs and symptoms are identified upon initiation of Human Response Clinical Practice Guideline (CPG).   Outcome: Not Progressing   06/26/17 0518   Self-Care Deficit (Adult,Obstetrics,Pediatric)   Related Risk Factors (Self-Care Deficit) cognitive impairment;disease process;medication effects   Signs and Symptoms (Self-Care Deficit) cognitive changes;weakness, paresis, paralysis;decreased balance     Goal: Improved Ability to Perform BADL and IADL  Patient will demonstrate the desired outcomes by discharge/transition of care.   Outcome: Not Progressing   06/26/17 0518   Self-Care Deficit (Adult,Obstetrics,Pediatric) Improved Ability to Perform BADL and IADL unable to achieve outcome       Problem: Liver Failure, Acute/Chronic (Adult)  Goal: Signs and Symptoms of Listed Potential Problems Will be Absent, Minimized or Managed (Liver Failure, Acute/Chronic)  Signs and symptoms of listed potential problems will be absent, minimized or managed by discharge/transition of care (reference Liver Failure, Acute/Chronic (Adult) CPG).  Outcome: Not Progressing   06/26/17 0518   Liver Failure, Acute/Chronic (Adult)   Problems Assessed (Liver Failure, Acute/Chronic) all   Problems Present (Liver Failure) neurologic deterioration;fluid/electrolyte imbalance

## 2017-06-26 NOTE — Unmapped (Signed)
Problem: Patient Care Overview  Goal: Plan of Care Review  Outcome: Progressing  Pt alert and oriented x 2-3.no complaints of pain this shift.He remains on CIWA with scores below 5.No signs of withdrawals noted thus far.Pt on lactulose and has had 2 large BM thus far.Falls free with no falls reported today.Falls protocol in progress.Pt pleasant and cooperative with plan of care.Pt resting at this time with no needs voiced.VSS.will continue to monitor.plan of care continues   06/26/17 1433   OTHER   Plan of Care Reviewed With patient   Plan of Care Review   Progress improving     Goal: Individualization and Mutuality  Outcome: Progressing   06/26/17 1433   Individualization   Patient Specific Goals (Include Timeframe) pt will remain falls free during hospitalization   Patient Specific Interventions falls prevention in place,hourly rounding,proactive toileting,call bell within reach     Goal: Discharge Needs Assessment  Outcome: Progressing   06/26/17 1433   Discharge Needs Assessment   Concerns to be Addressed no discharge needs identified   Readmission Within the Last 30 Days no previous admission in last 30 days     Goal: Interprofessional Rounds/Family Conf  Outcome: Progressing   06/26/17 1433   Interdisciplinary Rounds/Family Conf   Participants physician;patient;nursing       Problem: Fall Risk (Adult)  Intervention: Reduce Risk/Promote Restraint Free Environment   06/26/17 1433   Interventions   Safety Interventions low bed;bed alarm;fall reduction program maintained     Intervention: Review Medications/Identify Contributors to Fall Risk   06/26/17 1433   Interventions   Medication Review/Management medications reviewed       Goal: Identify Related Risk Factors and Signs and Symptoms  Related risk factors and signs and symptoms are identified upon initiation of Human Response Clinical Practice Guideline (CPG).   Outcome: Progressing   06/26/17 1433   Fall Risk (Adult)   Related Risk Factors (Fall Risk) fatigue/slow reaction;environment unfamiliar   Signs and Symptoms (Fall Risk) presence of risk factors     Goal: Absence of Fall  Patient will demonstrate the desired outcomes by discharge/transition of care.   Outcome: Progressing   06/26/17 1433   Fall Risk (Adult)   Absence of Fall making progress toward outcome       Problem: Self-Care Deficit (Adult,Obstetrics,Pediatric)  Intervention: Promote Patient Activity Focusing on Functional Independence   06/26/17 1433   Interventions   Activity Management activity adjusted per tolerance   Activity Assistance Provided assistance, 1 person      06/26/17 1433   Interventions   Activity Management activity adjusted per tolerance   Activity Assistance Provided assistance, 1 person     Intervention: Support Psychosocial Response to Functional Status   06/26/17 1433   Interventions   Supportive Measures active listening utilized   Trust Relationship/Rapport thoughts/feelings acknowledged;questions answered       Goal: Identify Related Risk Factors and Signs and Symptoms  Related risk factors and signs and symptoms are identified upon initiation of Human Response Clinical Practice Guideline (CPG).   Outcome: Progressing   06/26/17 1433   Self-Care Deficit (Adult,Obstetrics,Pediatric)   Related Risk Factors (Self-Care Deficit) fatigue/weakness;disease process;cognitive impairment   Signs and Symptoms (Self-Care Deficit) unable to perform bathing/hygiene tasks     Goal: Improved Ability to Perform BADL and IADL  Patient will demonstrate the desired outcomes by discharge/transition of care.   Outcome: Progressing   06/26/17 1433   Self-Care Deficit (Adult,Obstetrics,Pediatric)   Improved Ability to Perform BADL and IADL making  progress toward outcome       Problem: Liver Failure, Acute/Chronic (Adult)  Intervention: Promote Neurologic Homeostasis   06/26/17 1433   Interventions   Activity Management activity adjusted per tolerance       Goal: Signs and Symptoms of Listed Potential Problems Will be Absent, Minimized or Managed (Liver Failure, Acute/Chronic)  Signs and symptoms of listed potential problems will be absent, minimized or managed by discharge/transition of care (reference Liver Failure, Acute/Chronic (Adult) CPG).   Outcome: Progressing   06/26/17 1433   Liver Failure, Acute/Chronic (Adult)   Problems Assessed (Liver Failure, Acute/Chronic) all   Problems Present (Liver Failure) none

## 2017-06-26 NOTE — Unmapped (Signed)
[facility]  Emergency Department Provider Note    ED Clinical Impression     Final diagnoses:   Hepatic encephalopathy (CMS-HCC) (Primary)   Decompensated hepatic cirrhosis (CMS-HCC)       Initial Impression, ED Course, Assessment and Plan     Impression: 61 year old male presenting with altered mental status in the setting of liver failure. Seems most consistent with hepatic encephalopathy local workup other causes. Given recent concern for follow-up CT scan of the head. We'll check labs. Belly is nontender not significantly distended. Very low suspicion for SBP.    Workup negative except elevated ammonia. We'll give lactulose and admit to family medicine. Concerned that patient may not be safe at home and may need higher level of care.    Additional Medical Decision Making     I independently visualized the radiology images and agree with radiology reads except where noted above.  I independently visualized the EKG tracing.  I discussed the case with the  admitting provider.  I reviewed prior records in Epic as detailed in my note.  Additional history was obtained from daughter as detailed in my note.    Labs and radiology results that were available during my care of the patient were independently reviewed by me and considered in my medical decision making.    Portions of this record have been created using Scientist, clinical (histocompatibility and immunogenetics). Dictation errors have been sought, but may not have been identified and corrected.  ____________________________________________    Time seen: June 26, 2017 12:55 AM    I have reviewed the triage vital signs and the nursing notes.     History     Chief Complaint  Confusion      HPI   Ray Bell is a 61 y.o. male with history of fatty liver and cirrhosis presenting with his daughter for concerns for altered mental status. Patient reports that she was called by the home health nurse today. They were noted him to have increased confusion. He was trying to clean up spelled soda and screw the top on with bologna. Daughter is concerned that he might not be taking his medications as prescribed. She verbalizes his medications but often times he is missing his afternoon pills. She denies any fevers. Patient denies any abdominal pain. She was worried he might of had a fall today although he denies. She noted dried blood at his nares and his lip.       Past Medical History:   Diagnosis Date   ??? Colon cancer (CMS-HCC)    ??? Diabetes mellitus (CMS-HCC)    ??? Hypertension    ??? 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 Alcohol use disorder, severe, dependence (CMS-HCC)   ??? Alcoholic cirrhosis of liver with ascites (CMS-HCC)   ??? Hepatic encephalopathy (CMS-HCC)   ??? Macrocytic anemia   ??? Decompensated hepatic cirrhosis (CMS-HCC)   ??? Fever       Past Surgical History:   Procedure Laterality Date   ??? COLON SURGERY      colectomy in 2011         Current Facility-Administered Medications:   ???  albuterol (PROVENTIL HFA;VENTOLIN HFA) 90 mcg/actuation inhaler 2 puff, 2 puff, Inhalation, Q4H PRN, Elane Fritz, MD  ???  amitriptyline (ELAVIL) tablet 50 mg, 50 mg, Oral, Nightly, Laurel S Wallace, DO, 50 mg at 06/28/17 2032  ???  citalopram (CeleXA) tablet 40  mg, 40 mg, Oral, Daily, Elane Fritz, MD, 40 mg at 06/28/17 0831  ???  colchicine tablet 0.6 mg, 0.6 mg, Oral, Daily, Elane Fritz, MD, 0.6 mg at 06/28/17 0831  ???  fluticasone-salmeterol (ADVAIR) 100-50 mcg/dose diskus inhaler 1 puff, 1 puff, Inhalation, BID, Elane Fritz, MD, 1 puff at 06/28/17 2032  ???  folic acid (FOLVITE) tablet 1 mg, 1 mg, Oral, Daily, Lake Bells, MD, 1 mg at 06/28/17 1610  ???  furosemide (LASIX) tablet 60 mg, 60 mg, Oral, Daily, Elane Fritz, MD, 60 mg at 06/28/17 0831  ???  ketoconazole (NIZORAL) 2 % cream 1 application, 1 application, Topical, Daily, Priscille Schettini, MD, 1 application at 06/28/17 1845  ???  lactulose (CEPHULAC) packet 20 g, 20 g, Oral, TID, Elane Fritz, MD, 20 g at 06/28/17 2032  ???  pantoprazole (PROTONIX) EC tablet 40 mg, 40 mg, Oral, Daily, Elane Fritz, MD, 40 mg at 06/28/17 0831  ???  rifAXIMin (Xifaxan) tablet 550 mg, 550 mg, Oral, BID, Elane Fritz, MD, 550 mg at 06/28/17 2032  ???  spironolactone (ALDACTONE) tablet 100 mg, 100 mg, Oral, Daily, Elane Fritz, MD, 100 mg at 06/28/17 0831  ???  thiamine (B-1) injection 200 mg, 200 mg, Intravenous, Daily, Lake Bells, MD, 200 mg at 06/28/17 0830  ???  traZODone (DESYREL) tablet 150 mg, 150 mg, Oral, Nightly, Elane Fritz, MD, 150 mg at 06/28/17 2032    Allergies  Venlafaxine    History reviewed. No pertinent family history.    Social History  Social History   Substance Use Topics   ??? Smoking status: Never Smoker   ??? Smokeless tobacco: Never Used   ??? Alcohol use 4.8 oz/week     8 Cans of beer per week      Comment: Quit 04/25/2017; history of EtOH use disorder, drank 6 pack per day       Review of Systems  Please see HPI above for pertinent positives and negatives. All other systems have been reviewed and are negative except as otherwise documented.    Physical Exam     ED Triage Vitals [06/25/17 2112]   Enc Vitals Group      BP 104/73      Heart Rate 80      SpO2 Pulse       Resp 16      Temp 36.5 ??C (97.7 ??F)      Temp Source Skin      SpO2 100 %      Weight       Height       Head Circumference       Peak Flow       Pain Score       Pain Loc       Pain Edu?       Excl. in GC?        Constitutional: Alert and oriented Only to self and place. He was confused to the time and answering questions inappropriately at times.. Well appearing and in no distress.  Eyes: Conjunctivae are mildly icteric.  ENT       Head: Normocephalic and atraumatic.       Nose: No congestion.       Mouth/Throat: Mucous membranes are moist.       Neck: No stridor.  Hematological/Lymphatic/Immunilogical: No cervical lymphadenopathy.  Cardiovascular: Normal rate, regular rhythm. Normal and symmetric distal pulses are present in all extremities. No pedal  edema.   Respiratory: Normal respiratory effort. Breath sounds are normal.  Gastrointestinal: Soft and nontender. Mild to moderate ascites. There is no CVA tenderness.  Musculoskeletal: Nontender with normal range of motion in all extremities.  Neurologic: Normal speech and language. No gross focal neurologic deficits are appreciated. Asterixis noted bilaterally.   Skin: Skin is warm, dry and intact.         EKG     See procedure note if obtained.    Radiology     See imaging tab for radiology reports.    Procedures     See procedure note if performed.           Gilman Schmidt, MD  06/28/17 2312

## 2017-06-26 NOTE — Unmapped (Signed)
Patient rounds completed. The following patient needs were addressed:  Positioning;   SUPINE, Plan of Care, Call Bell in Reach and Bed Position Low .

## 2017-06-26 NOTE — Unmapped (Signed)
Report given to Joyce Copa RN. Patient care transferred at this time.

## 2017-06-26 NOTE — Unmapped (Signed)
PT was dx with cirrhosis in September. Saw liver specialist yesterday for initial eval. Per pts daughter, pt is acutely confused today. Pt appears slow to answer questions but NAD noted.

## 2017-06-26 NOTE — Unmapped (Signed)
Bedside commode ordered for pt

## 2017-06-26 NOTE — Unmapped (Signed)
Family Medicine Inpatient Service    History and Physical Note    Team: [facility] (pgr [phone])    PCP: Babs Bertin, NP  Date of Admission: June 25, 2017  Code Status: full code  Emergency Contact: Derrill Memo (daughter), [phone]    ASSESSMENT / PLAN:   Ray Bell is a 61 y.o. male with a past medical history significant for alcoholic cirrhosis, COPD, HTN, T2DM who presents with altered mental status in the setting of not taking his medications properly.     #Altered mental status: Most likely 2/2 hepatic encephalopathy due to poor medication adherence. Patient's ammonia is elevated and he has asterixis on exam. He has no signs/symptoms of infection or bleeding, his electrolytes are WNL, his CBC is per his baseline, lactate slightly elevated (2.2) but lower than previous admission. His head CT was negative for intracranial bleed and he has no focal neurologic deficits.    - EKG   - Resume home rifaximin 550 mg BID   - Resume home lactulose 20g TID for 3-4 BMs per day   - Hold anticholinergic medications (amitriptyline)   - Delirium precautions, fall precautions    # Alcoholic Cirrhosis: MELD-Na 23, Child-Pugh Class C. 90 day mortality 14-15%. Currently decompensated and non-adherent. Sober since diagnosis (04/25/17).   - LVP scheduled for 11/5, consider doing while inpatient   - Continue home lactulose, rifaximin (as above)   - Continue home lasix 60 mg daily   - Continue home spironolactone 100 mg daily   - Continue home protonix 40 mg daily    #Social: Lives alone with [facility] nursing once/week. Daughter lives close by but works 12 hour days.   - CM consult for dispo planning, consider increasing HH frequency    Chronic problems:    #Macrocytic anemia: chronic, stable, asymptomatic. 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 B12 and folate previously WNL. Baseline Hgb 9-10.       #COPD, stable: No home O2 requirement.   - Continue advair 100-50 mcg BID   - Albuterol nebulizer q4h PRN    #T2DM, stable: BG 97-131 in ED.   - Hold home glipizide XL 5 mg daily and metformin 1g BID   - ISS    #HTN, stable: Normotensive on admission.   - Continue lasix and spironolactone as above    #Depression, stable:   - Continue citalopram 40 mg daily    #Insomnia:   - Holding amitriptyline 100 mg nightly   - Continue trazodone 150 mg nightly    #Gout:   - continue colchicine 0.6 mg daily    #Other   - discontinue aspirin (bleeding risk) and atorvastatin (less than 10 year life expectancy)    # FEN/GI:  - IVF None  - Check electrolytes as indicated, replete as needed.  - Diet salt restricting    # PPX:   - DVT: Holding anticoagulation (INR therapeutic)    # Dispo: Floor  [ ]  Anticipated Discharge Location: Home  [ ]  PT/OT/DME: No needs anticipated  [ ]  CM/SW needs: CM to arrange home health for medication assistance  [ ]  Meds/Rx:  Not yet prescribed. No special med needs  [ ]  Teaching: None anticipated  [ ]  Follow up appt: Appt needed  [ ]  Excuse letter: None anticipated  [ ]  Transport: Daughter    HISTORY OF PRESENT ILLNESS:  Ray Bell is a 61 y.o. male with a history of alcohol use disorder, alcoholic cirrhosis with ascites, hepatic encephalopathy (discharged from [facility] on 10/26), colon  adenocarcinoma s/p colectomy (2011), COPD, HTN, and T2DM who presents with altered mental status.    Per daughter, the patient was at his baseline 1 day ago when he was seen at the [facility]. Then today when his [facility] nurse was visiting him, he seemed confused and was not acting himself. He has made no other complaints, though the nurse noted that he had some dried blood on his face and lip. Per the daughter, he lives alone and she is in charge of filling his pill box. He often misses his p.m. Medications. She did notice his urine seemed dark and she was concerned it was bloody. She does not know if he's been having regular bowel movements.    11/1 - Liver clinic started xifaxin and increased lasix to 60 mg daily    The patient denies any fever, chills, vision changes, headache, weakness/numbness/tingling, chest pain, palpitations, SOB, cough, abdominal pain, nausea/vomiting, diarrhea, constipation. He does not feel he is confused.    ED Course:    - 1x lactulose 20g    PAST MEDICAL / SURGICAL HX:  Past Medical History:   Diagnosis Date   ??? Colon cancer (CMS-HCC)    ??? Diabetes mellitus (CMS-HCC)    ??? Hypertension    ??? 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 COLON SURGERY      colectomy in 2011       FAMILY HX:   History reviewed. No pertinent family history.    SOCIAL HX:  Social History     Social History   ??? Marital status: Married     Spouse name: N/A   ??? Number of children: N/A   ??? Years of education: N/A     Social History Main Topics   ??? Smoking status: Never Smoker   ??? Smokeless tobacco: Never Used   ??? Alcohol use 4.8 oz/week     8 Cans of beer per week      Comment: Quit 04/25/2017; history of EtOH use disorder, drank 6 pack per day   ??? Drug use: No   ??? Sexual activity: Not Currently     Other Topics Concern   ??? None     Social History Narrative   ??? None       MEDICATIONS / ALLERGIES:    (Not in a hospital admission)    Allergies   Allergen Reactions   ??? Venlafaxine        IMMUNIZATIONS:  Immunization History   Administered Date(s) Administered   ??? Hepatitis B, Adult 06/17/2017   ??? PNEUMOCOCCAL POLYSACCHARIDE 23 06/18/2017       REVIEW OF SYSTEMS:  Pertinent positives and negatives per HPI. A complete review of systems otherwise negative    PHYSICAL EXAM:    Initial ED Vitals:   ED Triage Vitals [06/25/17 2112]   Enc Vitals Group      BP 104/73      Heart Rate 80      SpO2 Pulse       Resp 16      Temp 36.5 ??C      Temp Source Skin      SpO2 100 %      Weight       Height       Head Circumference       Peak Flow       Pain Score  Pain Loc       Pain Edu?       Excl. in GC?        Recent Vitals:  Vitals:    06/25/17 2112   BP: 104/73   Pulse: 80   Resp: 16   Temp: 36.5 ??C   SpO2: 100% GEN: well appearing, lying in bed, pleasant, NAD  Eyes: Scleral icterus. PERRL. Conjunctiva non-erythematous. EOMI  HEENT: NCAT, MMM. Oropharynx clear.  Neck: Supple.  Lymphadenopathy: No cervical or supraclavicular LAD.  CV: Regular rate and rhythm. Grade II/V systolic murmur. No chostochondral tenderness. No cyanosis or clubbing. Cap Refill < 2s  Pulm: CTAB. No wheezing, crackles, or rhonchi.  Abd: Distended, + fluid wave. Soft, Nontender. No guarding, rebound.  Normoactive bowel sounds.    Neuro: A&O x 2 (self and place). Asterixis. No focal deficits. Strength 5/5 UE/LE. Distal sensation to light touch intact.  Ext: 2+ pitting pedal edema.  Palpable distal pulses.  Skin: Mild erythema and scale of BL LEs. Telangiectasias.       LABS/ STUDIES:      Recent Labs  Lab Units 06/25/17  2202 06/24/17  0942   WBC 10*9/L 5.2 5.0   HEMOGLOBIN g/dL 9.5* 9.9*   HEMATOCRIT % 29.2* 30.4*   MCV fL 104.3* 104.5*   PLATELET COUNT (1) 10*9/L 91* 92*         Recent Labs  Lab Units 06/25/17  2203 06/24/17  0942   SODIUM mmol/L 137 139   POTASSIUM mmol/L 3.6 3.9   CHLORIDE mmol/L 107 106   CO2 mmol/L 25.0 26.0   BUN mg/dL 13 12   CREATININE mg/dL 1.61* 0.96*   GLUCOSE mg/dL 045 97   CALCIUM mg/dL 8.5 8.6   ALBUMIN g/dL 2.6* 2.7*   ALT U/L 51 48   AST U/L 55 65*   ALK PHOS U/L 81 83   BILIRUBIN TOTAL mg/dL 5.9* 5.4*   PROTEIN TOTAL g/dL 6.5 6.6         Recent Labs  Lab Units 06/25/17  2202 06/24/17  0942   INR  2.14 2.11   PT sec 24.4* 24.1*   APTT sec 36.4  --        No results in the last week    IMAGING:   EXAM: Computed tomography, head or brain without contrast material.  DATE: 06/25/2017 10:30 PM  ACCESSION: [id number]  DICTATED: 06/25/2017 10:50 PM  INTERPRETATION LOCATION: [facility]    CLINICAL INDICATION: 61 years old Male with AMS-- ??    COMPARISON: None    TECHNIQUE: Axial CT images of the head ??from skull base to vertex without contrast.     FINDINGS: There is no midline shift or mass lesion. ??There is no evidence of intracranial hemorrhage or acute infarct. There are scattered and confluent hypodense foci within the periventricular and deep white matter. ??These are nonspecific but commonly associated with small vessel ischemic changes. No fractures are evident. Partially imaged right maxillary sinus polypoid mucosal thickening.    ??      Impression     No acute intracranial process.       Elane Fritz, MD PGY1  June 25, 2017 11:11 PM

## 2017-06-26 NOTE — Unmapped (Signed)
Patient rounds completed. The following patient needs were addressed:  Pain, Toileting, Personal Belongings, Plan of Care, Call Bell in Reach and Bed Position Low .

## 2017-06-26 NOTE — Unmapped (Signed)
Report given to [place], [place]. Patient care transferred at this time.

## 2017-06-27 DIAGNOSIS — K7290 Hepatic failure, unspecified without coma: Principal | ICD-10-CM

## 2017-06-27 LAB — COMPREHENSIVE METABOLIC PANEL
ALBUMIN: 2.8 g/dL — ABNORMAL LOW (ref 3.5–5.0)
ALKALINE PHOSPHATASE: 80 U/L (ref 38–126)
ALT (SGPT): 47 U/L (ref 19–72)
ANION GAP: 7 mmol/L — ABNORMAL LOW (ref 9–15)
AST (SGOT): 57 U/L — ABNORMAL HIGH (ref 19–55)
BILIRUBIN TOTAL: 6.5 mg/dL — ABNORMAL HIGH (ref 0.0–1.2)
BLOOD UREA NITROGEN: 8 mg/dL (ref 7–21)
CALCIUM: 8.4 mg/dL — ABNORMAL LOW (ref 8.5–10.2)
CHLORIDE: 107 mmol/L (ref 98–107)
CO2: 26 mmol/L (ref 22.0–30.0)
CREATININE: 0.73 mg/dL (ref 0.70–1.30)
EGFR MDRD AF AMER: >=60 mL/min/{1.73_m2} (ref >=60)
EGFR MDRD NON AF AMER: >=60 mL/min/{1.73_m2} (ref >=60)
GLUCOSE RANDOM: 115 mg/dL (ref 65–179)
POTASSIUM: 4.5 mmol/L (ref 3.5–5.0)
PROTEIN TOTAL: 6.8 g/dL (ref 6.5–8.3)

## 2017-06-27 LAB — CBC
HEMATOCRIT: 30.5 % — ABNORMAL LOW (ref 41.0–53.0)
HEMOGLOBIN: 10 g/dL — ABNORMAL LOW (ref 13.5–17.5)
MEAN CORPUSCULAR HEMOGLOBIN CONC: 32.8 g/dL (ref 31.0–37.0)
MEAN CORPUSCULAR HEMOGLOBIN: 34.4 pg — ABNORMAL HIGH (ref 26.0–34.0)
MEAN CORPUSCULAR VOLUME: 105 fL — ABNORMAL HIGH (ref 80.0–100.0)
MEAN PLATELET VOLUME: 8.9 fL (ref 7.0–10.0)
PLATELET COUNT: 83 10*9/L — ABNORMAL LOW (ref 150–440)
RED BLOOD CELL COUNT: 2.91 10*12/L — ABNORMAL LOW (ref 4.50–5.90)
RED CELL DISTRIBUTION WIDTH: 17.9 % — ABNORMAL HIGH (ref 12.0–15.0)

## 2017-06-27 LAB — URINALYSIS
BACTERIA: NONE SEEN /HPF
BILIRUBIN UA: NEGATIVE
BLOOD UA: NEGATIVE
GLUCOSE UA: NEGATIVE
NITRITE UA: NEGATIVE
PH UA: 6 (ref 5.0–9.0)
SPECIFIC GRAVITY UA: 1.01 (ref 1.003–1.030)
UROBILINOGEN UA: >=8
WBC UA: <1 /HPF (ref <2)

## 2017-06-27 LAB — BUN / CREAT RATIO: Urea nitrogen/Creatinine:MRto:Pt:Ser/Plas:Qn:: 11

## 2017-06-27 LAB — PLATELET COUNT: Lab: 83 — ABNORMAL LOW

## 2017-06-27 LAB — AMMONIA: Ammonia:SCnc:Pt:Plas:Qn:: 31

## 2017-06-27 LAB — LACTATE BLOOD VENOUS: Lactate:SCnc:Pt:BldV:Qn:: 2.8 — ABNORMAL HIGH

## 2017-06-27 LAB — BLOOD GAS, VENOUS
BASE EXCESS VENOUS: 0.6 (ref -2.0–2.0)
HCO3 VENOUS: 24 mmol/L (ref 22–27)
O2 SATURATION VENOUS: 74.4 % (ref 40.0–85.0)
PCO2 VENOUS: 38 mmHg — ABNORMAL LOW (ref 40–60)
PH VENOUS: 7.43 (ref 7.32–7.43)

## 2017-06-27 LAB — BASE EXCESS VENOUS: Base excess:SCnc:Pt:BldV:Qn:Calculated: 0.6

## 2017-06-27 LAB — MAGNESIUM: Magnesium:MCnc:Pt:Ser/Plas:Qn:: 1.6

## 2017-06-27 LAB — BLOOD UA: Lab: NEGATIVE

## 2017-06-27 NOTE — Unmapped (Addendum)
PCP/outpatient follow-up:  [ ]  f/u medication compliance; consider electronic pill box  [ ]  f/u home BMs (lactulose increased to 40mg  TID to achieve 3-4 BMs/day; powder prescribed as pt thinks that solution is too messy)  [ ]  hepatology appt 11/29: will get second HBV (last one 10/25)  [ ]  screening EGD for varices   [ ]  radiology recommends short-term follow-up in 2 months (on 08/17/17) with MRI using cooperative patient protocol or biphasic liver CT  [ ]  holding home metformin on discharge; last A1c 5% 10/23 and good glucose control  [ ]  connect with PACE program once he gets Medicare coverage  [ ]  continue to provide resources for family ([employer]; http://[place]-rivera.info/)   [ ]  f/u depression/insomnia: Amitriptyline decreased to 50mg  due to AMS on presentation. Celexa decreased to 20mg  due to prolonged QTc    Ray Bell??is a 61 y.o.??male??with a past medical history significant for alcoholic cirrhosis, COPD, HTN, T2DM??who presented with altered mental status in the setting of not taking his medications for 5 days. AMS resolved within a day of resuming lactulose/rifaximin. Awaiting safe dispo plan during most of hospital stay.     PT/OT recommend 5x weekly/low intensity therapy. Daughter/ex-wife would like him in ALF/SNF because he is not able to perform ADLs/take medications appropriately. Patient adamantly refused to go to SNF/ALF despite reviewing risks of going home including missing medication doses and likelihood of returning back to the hospital. OT Living Skills Assessment revealed that he needs assistance to be safe and meet basic needs. Received walker during admission per PT recs. Per family wishes, decision made to d/c to home with home health aide (order sent to increase home health aide to 3x/day, will likely start in 1-2 weeks) and increased family support to ensure he is taking his medications properly and safe at home. Children will hold off on applying for guardianship. #Altered mental status - Ammonia Elevated 123 - Hepatic Encephalopathy : Presented with AMS most likely 2/2 hepatic encephalopathy due to poor medication adherence. Patient has not taken rifaximin and lactulose medication for a week prior to admission. Ammonia is elevated, mild scleral icterus and asterixis on exam. Electrolytes WNL, CBC is per his baseline, lactate slightly elevated (2.2) but lower than previous admission. His head CT was negative for intracranial bleed and he has no focal neurologic deficits. Resumed home rifaximin 550 mg BID, lactulose 20g TID for 3-4 BMs per day during hospital stay with rapid improvement in mental status. Initially held anticholinergic medications (amitriptyline) but resumed within several days at lower dose (50mg ). Increased lactulose to 40mg  TID to increase BMs to 3-4/day.  ??  # Ascites - Alcoholic Cirrhosis: MELD-Na 23, Child-Pugh Class C. 90 day mortality 14-15%. MRI 10/25 with moderate ascites (needs to be repeated in 2 months due to limited cooperation by patient). Decompensated on admission and non-adherent to medications at home with a distended, tense abdomen and bilateral lower pitting edema. Sober since diagnosis (04/25/17). Followed by Gastroenterology Vallery Sa, ANP and was seen on 11/1 with the plan to perform LVP on 06/28/17. LVP during hospital stay drained 2.1L on 11/5. Cytology without malignant cells; reactive mesothelial cells present, 136 nucleated cells, albumin <1, SAAG >1.1, cultures: 2+ PMNs, no organisms seen; IV albumin not indicated. Continued home lactulose, rifaximin (as above). Continued home lasix 60 mg daily, spironolactone 100 mg daily, protonix 40 mg daily (EGD for variceal screening ordered on 11/1). Potassium stable on admission.     #Seborrheic dermatitis, facial: Mild improvement with  ketoconazole 2% cream BID.    # Mild cognitive impairment: MOCA score 16/30 during hospital stay. Has not been formally diagnosed with dementia.

## 2017-06-27 NOTE — Unmapped (Addendum)
OCCUPATIONAL THERAPY  Evaluation (06/27/17 1250)    Patient Name:  Ray Bell       Medical Record Number: [id number]   Date of Birth: September 01, 1955  Sex: Male          OT Treatment Diagnosis:  ADL, functional cognition + functional mobilty concerns 2/2 AMS     Assessment  Ray Bell is a pleasant 61 y.o. male with a past medical history significant for alcoholic cirrhosis, COPD, HTN, T2DM who presents with altered mental status in the setting of not taking his medications for 5 days. Pt presents to acute OT demonstrating independence with ADL + functional mobility, though demonstrating some cognitive deficits (disorientation, insight into deficits). Per H&P: Per the daughter, he lives alone and she is in charge of filling his pill box. He often misses his p.m. medications. Pt reported to therapist that he never forgets his medication and remembers to take his pills by writing down when he took them.     Therapist paged [facility] on call: May benefit from SLP consult to help ID what needs he has in order to be safe at home.    Addendum 5:30pm: Based on d/w RN Ellie and PT Charisse Klinefelter re: pt having significantly altered cognition and physical ability compared to OT session at 1pm, therapist presented for re-assessment. Pt found in bed receiving assessments from MDs and RN, noted to have a fever and SBP of 176. Pt was inconsistently responsive (often unresponsive) to questioning and verbal/visual motor commands. He was unable to recall his grandson's name and selected the wrong name from 2 options. He was however able to recall the university that this therapist attended, which was a topic of conversation during this afternoon's eval. Based on this significant change in cognition and motor planning deficits, will update recommendation to keep pt on acute OT caseload and recommend 5x/week low intensity post-acute OT to maximize his functional independence, decrease burden of care, and ensure safe return home. Therapist will also plan to administer the [facility] Functional Living Scales assessment (per MD request) once pt is able to successfully participate. Therapist asked pt's daughter Ray Bell to bring his glasses in preparation for the assessment. Ray Bell also reported the pt's cognition has been declining for ~7 months (began with forgetfulness) but has been declining rapidly over the past ~4 months. She reports his 1x/wk home health nurse told her she found the pt washing each slice of bologna in a new package last Friday. Ray Bell reports the pt has had a tremor for multiple years and that it waxes and wanes.         Activity Tolerance During Today's Session  Patient tolerated treatment well    Plan  Planned Frequency of Treatment:  1-2x per day for: 3-4x week    Planned Interventions:  Adaptive equipment;ADL retraining;Balance activities;Bed mobility;Compensatory tech. training;Conservation;Endurance activities;Education - Patient;Education - Family / caregiver;Functional cognition;Functional mobility;Home exercise program;Modalities;Neuromuscular re-education;Postular / Proximal stability;Positioning;Range of motion;Safety education;Therapeutic exercise;Transfer training;UE Strength / coordination exercise;Visual / perceptual tasks    Post-Discharge Occupational Therapy Recommendations:  OT Post Acute Discharge Recommendations: 5x weekly;Low intensity    ;    OT DME Recommendations: Defer to post acute  GOALS:   Patient and Family Goals: to go home       Long Term Goal #1: TBD pending re-assessment       Short Term:  Pt will participate in living skills assessment   Time Frame : 1 week  Pt will participate in ADL  re-assessment   Time Frame : 1 week  Pt will participate in functional mobility re-assessment   Time Frame : 1 week       Prognosis:  Good  Positive Indicators:  Motivation, family and neighbor support, PLOF  Barriers to Discharge: Cognitive deficits;Poor insight into deficits    Subjective  Current Status pt received supine in bed, left sitting EOB eating lunch, bed alarm on, Call bell in reach. RN aware.   Prior Functional Status Pt reports independence w/ ADLs, IADLs, and functional mobility. Pt does not drive 2/2 his liscence being taken away. Pt states that he never forgets to take his medication and remembers by 'writing it down after he takes it'.     Medical Tests / Procedures: Reviewed chart   Services patient receives: PT;OT (paged team suggesting SLP consult)  Patient / Caregiver reports: I just want to go home ; No thanks, Im a country boy when asked if he wanted to use a knife to cut his chicken     Past Medical History:   Diagnosis Date   ??? Colon cancer (CMS-HCC)    ??? Diabetes mellitus (CMS-HCC)    ??? Hypertension    ??? 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 Smoking status: Never Smoker   ??? Smokeless tobacco: Never Used   ??? Alcohol use 4.8 oz/week     8 Cans of beer per week      Comment: Quit 04/25/2017; history of EtOH use disorder, drank 6 pack per day      Past Surgical History:   Procedure Laterality Date   ??? COLON SURGERY      colectomy in 2011    History reviewed. No pertinent family history.     Venlafaxine     Objective Findings  Precautions / Restrictions  Falls precautions;Other precautions (delirium prevention)    Weight Bearing  Non-applicable    Required Braces or Orthoses  Non-applicable    Communication Preference  Verbal;Visual    Pain  No c/o pain    Equipment / Environment  Vascular access (PIV, TLC, Port-a-cath, PICC)    Living Situation   Living environment: Trailer   Lives With: Alone (Pt has a daughter that lives near by & neighbor, Lennox Grumbles that helps him out)   Home Living: One level home;Stairs to enter with rails;Tub/shower unit;Standard height toilet   Equipment available at home: None   Rail placement (outside): Bilateral rails        Cognition   Orientation Level:  Disoriented to time;Disoriented to situation (Pt reporting it was January, unable to recall date or year. Stated that he was admitted for swelling)   Arousal/Alertness:  Appropriate responses to stimuli   Attention Span:  Appears intact   Memory:  Appears intact   Following Commands:  Follows all commands and directions without difficulty   Safety Judgment:  Good awareness of safety precautions   Awareness of Errors:  Good awareness of safety precautions   Problem Solving:  Able to problem solve independently   Comments:   SEE ADDENDUM ABOVE IN ASSESSMENT SECTION FOR UPDATE ON COGNITION FROM 5:30PM    The Orientation Log was utilized during today's Occupational Therapy intervention and assesses the patient's ability to recall place, time & situation.  The patient scored 12/30 demonstrating disorientation.  -Pt able to independently state city, type of building, name of hospital, and time.  -Pt unable to state month, date,  year, day, reason for admit, and pathological deficits despite logical and categorical cueing.      Vision / Perception  Vision: Wears glasses all the time  Perception: Appears WNL   Comments: Pt able to read the number on the wall but unable to read small type close up.     Hand Function  Hand Dominance: L  WFL; mild B-hand tremors    Skin Inspection  Visual skin c/d/i     ROM / Strength/Coordination  UE ROM/ Strength/ Coordination: BUE WFL, mild BUE tremor  LE ROM/ Strength/ Coordination: BLE WFL     Sensation:  No c/o numbness or tingling     Balance:  Supervision for unsupported sitting EOB & static standing     Mobility/Gait/Transfers: Independent for bed mobility, supervison for sit <> stand tf and functional mobility in room     ADL:     Grooming: Supervision for sinkside hand washing   Dressing: Supervision for donning/ doffing hospital sock EOB using figure 4.   Eating: Independent   Toileting: Supervision at standard commode       Vitals/ Orthostatics:  At Rest: NAD  With Activity: NAD         Interventions Performed During Today's Session: AMPAC 24/24 (WILL NEED RE-ASSESSMENT 2/2 PHYSICAL/COGNITIVE DECLINE AFTER SESSION), OT Role, POC, bed mobility, functional mobility, grooming, toileting, functional cognition and reorientation, safety education, meal setup    OT G-codes  Functional Assessment Tool Used: clinical reasoning  Functional Limitation: Self care  Self Care Current Status (Z6109): At least 40 percent but less than 60 percent impaired, limited or restricted  Self Care Goal Status (U0454): At least 20 percent but less than 40 percent impaired, limited or restricted  Rationale: change in presentation since 1pm assessment    Eval Duration (OT): 60 Min. (25 + 35 at 5:30pm)    Medical Staff Made Aware: RN Ellie     I attest that I have reviewed the above information.  Signed: Bernarda Caffey, OT  Filed 06/27/2017    I was physically present and immediately available to direct and supervise tasks that were related to patient management. The direction and supervision was continuous throughout the time these tasks were performed.     Bernarda Caffey, OT

## 2017-06-27 NOTE — Unmapped (Signed)
Consult received and services are being managed by the care management team.   06/27/2017 9:59 AM

## 2017-06-27 NOTE — Unmapped (Signed)
Problem: Patient Care Overview  Goal: Plan of Care Review  Outcome: Progressing  Pt alert and oriented x 3.no complaints of pain this shift.Pt up in chair and tolerated activity well.Pt had session with PT/OT.Tolerated well.Lactulose given.Pt voiding and eliminating well.Pt eating lunch at this time with no needs voiced.Falls free with bed alarm activated.Pt for possible discharge to SNF.Pt and family updated on plan of care.VSS.will continue to monitor.plan of care continues   06/27/17 1326   OTHER   Plan of Care Reviewed With patient   Plan of Care Review   Progress improving     Goal: Individualization and Mutuality  Outcome: Progressing   06/26/17 1433   Individualization   Patient Specific Goals (Include Timeframe) pt will remain falls free during hospitalization   Patient Specific Interventions falls prevention in place,hourly rounding,proactive toileting,call bell within reach     Goal: Discharge Needs Assessment  Outcome: Progressing   06/27/17 1326   Discharge Needs Assessment   Concerns to be Addressed no discharge needs identified   Readmission Within the Last 30 Days no previous admission in last 30 days     Goal: Interprofessional Rounds/Family Conf  Outcome: Progressing   06/27/17 1326   Interdisciplinary Rounds/Family Conf   Participants physician;patient;nursing       Problem: Fall Risk (Adult)  Goal: Identify Related Risk Factors and Signs and Symptoms  Related risk factors and signs and symptoms are identified upon initiation of Human Response Clinical Practice Guideline (CPG).   Outcome: Progressing   06/27/17 1326   Fall Risk (Adult)   Related Risk Factors (Fall Risk) fatigue/slow reaction;environment unfamiliar   Signs and Symptoms (Fall Risk) presence of risk factors     Goal: Absence of Fall  Patient will demonstrate the desired outcomes by discharge/transition of care.   Outcome: Progressing   06/27/17 1326   Fall Risk (Adult)   Absence of Fall making progress toward outcome       Problem: Self-Care Deficit (Adult,Obstetrics,Pediatric)  Intervention: Promote Patient Activity Focusing on Functional Independence   06/27/17 1326   Interventions   Activity Management ambulated in room;up in chair   Activity Assistance Provided assistance, 1 person     Intervention: Support Psychosocial Response to Functional Status   06/27/17 1326   Interventions   Supportive Measures active listening utilized   Trust Relationship/Rapport thoughts/feelings acknowledged       Goal: Identify Related Risk Factors and Signs and Symptoms  Related risk factors and signs and symptoms are identified upon initiation of Human Response Clinical Practice Guideline (CPG).   Outcome: Progressing   06/27/17 1326   Self-Care Deficit (Adult,Obstetrics,Pediatric)   Related Risk Factors (Self-Care Deficit) fatigue/weakness;disease process   Signs and Symptoms (Self-Care Deficit) unable to perform toileting/toilet hygiene tasks     Goal: Improved Ability to Perform BADL and IADL  Patient will demonstrate the desired outcomes by discharge/transition of care.   Outcome: Progressing   06/27/17 1326   Self-Care Deficit (Adult,Obstetrics,Pediatric)   Improved Ability to Perform BADL and IADL making progress toward outcome       Problem: Liver Failure, Acute/Chronic (Adult)  Intervention: Promote Neurologic Homeostasis   06/27/17 1326   Interventions   Activity Management ambulated in room       Goal: Signs and Symptoms of Listed Potential Problems Will be Absent, Minimized or Managed (Liver Failure, Acute/Chronic)  Signs and symptoms of listed potential problems will be absent, minimized or managed by discharge/transition of care (reference Liver Failure, Acute/Chronic (Adult) CPG).   Outcome:  Progressing   06/27/17 1326   Liver Failure, Acute/Chronic (Adult)   Problems Assessed (Liver Failure, Acute/Chronic) all   Problems Present (Liver Failure) none

## 2017-06-27 NOTE — Unmapped (Signed)
Problem: Patient Care Overview  Goal: Plan of Care Review  Outcome: Progressing   06/27/17    OTHER   Plan of Care Reviewed With patient   Plan of Care Review   Progress improving     Alert, pleasant and cooperative.Remains confused as to situation.Thinks he was recently told he had colon ca and that he did not have liver dz. Poor understanding of use of  medications in relation to worsening sx of liver dz. Several bowel movements since admission.Appetite good. Vs wnl. Bed alarm and Falls Precautions initiated.     Goal: Individualization and Mutuality  Outcome: Progressing   06/26/17 0510   Individualization   Patient Specific Goals (Include Timeframe) Return to baseline cognitive function prior to discharge       Problem: Fall Risk (Adult)  Goal: Identify Related Risk Factors and Signs and Symptoms  Related risk factors and signs and symptoms are identified upon initiation of Human Response Clinical Practice Guideline (CPG).   Outcome: Progressing   06/27/17    Fall Risk (Adult)   Related Risk Factors (Fall Risk) confusion/agitation;fatigue/slow reaction;environment unfamiliar   Signs and Symptoms (Fall Risk) presence of risk factors     Goal: Absence of Fall  Patient will demonstrate the desired outcomes by discharge/transition of care.   Outcome: Progressing   06/27/17    Fall Risk (Adult)   Absence of Fall making progress toward outcome       Problem: Self-Care Deficit (Adult,Obstetrics,Pediatric)  Goal: Identify Related Risk Factors and Signs and Symptoms  Related risk factors and signs and symptoms are identified upon initiation of Human Response Clinical Practice Guideline (CPG).   Outcome: Not Progressing   06/26/17 0518   Self-Care Deficit (Adult,Obstetrics,Pediatric)   Related Risk Factors (Self-Care Deficit) cognitive impairment;disease process;medication effects   Signs and Symptoms (Self-Care Deficit) cognitive changes;weakness, paresis, paralysis;decreased balance     Goal: Improved Ability to Perform BADL and IADL  Patient will demonstrate the desired outcomes by discharge/transition of care.   Outcome: Not Progressing   06/27/17    Self-Care Deficit (Adult,Obstetrics,Pediatric)   Improved Ability to Perform BADL and IADL unable to achieve outcome       Problem: Liver Failure, Acute/Chronic (Adult)  Goal: Signs and Symptoms of Listed Potential Problems Will be Absent, Minimized or Managed (Liver Failure, Acute/Chronic)  Signs and symptoms of listed potential problems will be absent, minimized or managed by discharge/transition of care (reference Liver Failure, Acute/Chronic (Adult) CPG).  Outcome: Not Progressing   06/27/17    Liver Failure, Acute/Chronic (Adult)   Problems Assessed (Liver Failure, Acute/Chronic) all   Problems Present (Liver Failure) neurologic deterioration;fluid/electrolyte imbalance

## 2017-06-28 LAB — CBC W/ AUTO DIFF
BASOPHILS ABSOLUTE COUNT: 0 10*9/L (ref 0.0–0.1)
EOSINOPHILS ABSOLUTE COUNT: 0.2 10*9/L (ref 0.0–0.4)
HEMATOCRIT: 27.1 % — ABNORMAL LOW (ref 41.0–53.0)
HEMOGLOBIN: 9.1 g/dL — ABNORMAL LOW (ref 13.5–17.5)
LARGE UNSTAINED CELLS: 3 % (ref 0–4)
LYMPHOCYTES ABSOLUTE COUNT: 0.5 10*9/L — ABNORMAL LOW (ref 1.5–5.0)
MEAN CORPUSCULAR HEMOGLOBIN: 35.7 pg — ABNORMAL HIGH (ref 26.0–34.0)
MEAN CORPUSCULAR VOLUME: 105.8 fL — ABNORMAL HIGH (ref 80.0–100.0)
MEAN PLATELET VOLUME: 8.8 fL (ref 7.0–10.0)
MONOCYTES ABSOLUTE COUNT: 0.4 10*9/L (ref 0.2–0.8)
NEUTROPHILS ABSOLUTE COUNT: 4.5 10*9/L (ref 2.0–7.5)
PLATELET COUNT: 76 10*9/L — ABNORMAL LOW (ref 150–440)
RED CELL DISTRIBUTION WIDTH: 18.2 % — ABNORMAL HIGH (ref 12.0–15.0)
WBC ADJUSTED: 5.8 10*9/L (ref 4.5–11.0)

## 2017-06-28 LAB — COMPREHENSIVE METABOLIC PANEL
ALBUMIN: 2.3 g/dL — ABNORMAL LOW (ref 3.5–5.0)
ALT (SGPT): 43 U/L (ref 19–72)
ANION GAP: 6 mmol/L — ABNORMAL LOW (ref 9–15)
AST (SGOT): 42 U/L (ref 19–55)
BILIRUBIN TOTAL: 5.4 mg/dL — ABNORMAL HIGH (ref 0.0–1.2)
BLOOD UREA NITROGEN: 10 mg/dL (ref 7–21)
BUN / CREAT RATIO: 16
CALCIUM: 7.9 mg/dL — ABNORMAL LOW (ref 8.5–10.2)
CHLORIDE: 109 mmol/L — ABNORMAL HIGH (ref 98–107)
CREATININE: 0.64 mg/dL — ABNORMAL LOW (ref 0.70–1.30)
EGFR MDRD AF AMER: >=60 mL/min/{1.73_m2} (ref >=60)
EGFR MDRD NON AF AMER: >=60 mL/min/{1.73_m2} (ref >=60)
GLUCOSE RANDOM: 89 mg/dL (ref 65–179)
POTASSIUM: 4.1 mmol/L (ref 3.5–5.0)
PROTEIN TOTAL: 5.9 g/dL — ABNORMAL LOW (ref 6.5–8.3)
SODIUM: 140 mmol/L (ref 135–145)

## 2017-06-28 LAB — ALT (SGPT): Alanine aminotransferase:CCnc:Pt:Ser/Plas:Qn:: 43

## 2017-06-28 LAB — HEMATOCRIT: Lab: 27.1 — ABNORMAL LOW

## 2017-06-28 LAB — PROTIME-INR: INR: 2.3

## 2017-06-28 LAB — PROTIME: Lab: 26.2 — ABNORMAL HIGH

## 2017-06-28 NOTE — Unmapped (Signed)
Family Medicine Inpatient Service    Progress Note    Team: [facility] (pgr [phone])    ASSESSMENT / PLAN:   Ray Bell is a 61 y.o. male with a past medical history significant for alcoholic cirrhosis, COPD, HTN, T2DM who presents with altered mental status in the setting of not taking his medications for 5 days.    #Altered mental status - Ammonia Elevated 123 - Hepatic Encephalopathy: Improving. Having adequate number of bowel movement.    - Resume home rifaximin 550 mg BID   - Resume home lactulose 20g TID for 3-4 BMs per day   - Hold anticholinergic medications (amitriptyline)   - Delirium precautions, fall precautions  ??  #Ascites - Alcoholic Cirrhosis: MELD-Na 23, Child-Pugh Class C. 90 day mortality 14-15%. MRI 10/25 with moderate ascites. Currently decompensated and non-adherent to medications at home. Patient has a distended, tense abdomen and bilateral lower pitting edema. Sober since diagnosis (04/25/17). Followed by Gastroenterology Vallery Sa, ANP and was seen on 11/1 with the plan to perform LVP today. 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 No h/o LVP. D/c CIWA.    - LVP today, drained 2.1L (does not appear infected, reactive mesothelial cells present, 136 nucleated cells, albumin <1, SAAG >1.1, 2+ PMNs, no organisms seen); IV albumin not indicated     - Continue home lactulose, rifaximin (as above)   - Continue home lasix 60 mg daily   - Continue home spironolactone 100 mg daily   - Continue home protonix 40 mg daily (EGD for variceal screening ordered on 11/1)    - folate and thiamine started last night      #Seborrheic dermatitis, facial:   - start ketoconazole 2% cream daily     #Macrocytic anemia: chronic, stable, asymptomatic. Presumably 2/2 liver disease. B12 and folate previously WNL. Baseline Hgb 9-10.     #COPD: stable, no home O2 requirement.   - Continue advair 100-50 mcg BID   - Albuterol nebulizer q4h PRN  ??  #T2DM: A1c 5.0% on 06/15/17  - Hold home glipizide XL 5 mg daily and metformin 1g BID  - Stop SSI - sugars have been well-controlled   ??  #HTN: stable   - Continue lasix and spironolactone as above  ??  #Depression: stable   - Continue citalopram 40 mg daily  ??  #Insomnia:   - Holding amitriptyline 100 mg nightly   - Continue trazodone 150 mg nightly  ??  #Gout:   - continue colchicine 0.6 mg daily    # FEN/GI:  - IVF None  - Check electrolytes as indicated, replete as needed.  - Diet Regular    # PPX: Held for bleeding risk; SCDs; restart Lovenox tomorrow    Code Status: Full code    # Dispo: Floor  [ ]  Anticipated Discharge Location: Daughter/ex-wife would like him in ALF/SNF because he is not able to perform ADLs/take medications appropriately. Daughter states patient may refuse.  [ ]  PT/OT/DME: PT/OT ordered: PT/OT recs 5x/low; OT living skills assessment tomorrow  [ ]  CM/SW needs: CM to arrange SNF   [ ]  Meds/Rx:  Not yet prescribed. No special med needs  [ ]  Teaching: None anticipated  [ ]  Follow up appt: Appt needed  [ ]  Excuse letter: None anticipated  [ ]  Transport: Private Needed    SUBJECTIVE:  Temp 38.7C at 1748 and appeared more confused with delayed responses. Labs at the time unremarkable. CXR nl, VBG nl, lactate 2.8. Urine  cx sent. Thiamine, folate started (though pt likely sober since September). CT head/LP considered but mental status improved overnight. Held off on SBP abx given that patient did not appear septic/without abdominal pain. Temp to 38.7 at 1900 to 38.3 at 2000. Temp down to 37.1 at midnight with Tylenol and 35.8 at 5am. Per nursing, this AM he is make much more sense and eating fulls meals. He appears to be back at baseline. Having regular BMs. CIWA 7 then 3 overnight for tremors (have been noted at baseline).     REVIEW OF SYSTEMS:  Pertinent positives and negatives as per interval events. A complete review of systems otherwise negative    PHYSICAL EXAM:      Intake/Output Summary (Last 24 hours) at 06/28/17 1038  Last data filed at 06/28/17 0615   Gross per 24 hour   Intake              118 ml   Output              200 ml   Net              -82 ml       Recent Vitals:  Vitals:    06/28/17 0753   BP: 132/63   Pulse: 82   Resp: 20   Temp: 36.6 ??C   SpO2: 98%       GEN: well appearing, lying in bed, NAD  Eyes: PERRL. Scleral icterus. Conjunctiva erythematous. EOMI  HEENT: NCAT, MMM.  Neck: Supple.  Lymphadenopathy: No cervical or supraclavicular LAD.  CV: Regular rate and rhythm. Systolic ejection murmur. No chostochondral tenderness. No cyanosis or clubbing. Cap Refill <3 seconds  Pulm: CTAB. No wheezing, crackles, or rhonchi.  Abd: Distended, tense. Nontender. No guarding, rebound.  Normoactive bowel sounds.    Neuro: A&O to person only. No asterixis.   Ext: 2+ bilateral lower extremity pitting edema up to knees.  Palpable distal pulses.  Skin: Mild erythema and peeling of bilateral LEs. Telangiectasias. Erythema/peeling skin on face (consistent with seborrheic dermatitis).       LABS/ STUDIES:      Recent Labs  Lab Units 06/28/17  0846 06/27/17  1836 06/25/17  2202   WBC 10*9/L 5.8 7.6 5.2   HEMOGLOBIN g/dL 9.1* 10.2* 9.5*   HEMATOCRIT % 27.1* 30.5* 29.2*   MCV fL 105.8* 105.0* 104.3*   PLATELET COUNT (1) 10*9/L 76* 83* 91*         Recent Labs  Lab Units 06/28/17  0846 06/27/17  1836 06/25/17  2203   SODIUM mmol/L 140 140 137   POTASSIUM mmol/L 4.1 4.5 3.6   CHLORIDE mmol/L 109* 107 107   CO2 mmol/L 25.0 26.0 25.0   BUN mg/dL 10 8 13    CREATININE mg/dL 7.25* 3.66 4.40*   GLUCOSE mg/dL 89 347 425   CALCIUM mg/dL 7.9* 8.4* 8.5   MAGNESIUM mg/dL  --  1.6  --    ALBUMIN g/dL 2.3* 2.8* 2.6*   ALT U/L 43 47 51   AST U/L 42 57* 55   ALK PHOS U/L 59 80 81   BILIRUBIN TOTAL mg/dL 5.4* 6.5* 5.9*   PROTEIN TOTAL g/dL 5.9* 6.8 6.5         Recent Labs  Lab Units 06/28/17  0846 06/25/17  2202 06/24/17  0942   INR  2.30 2.14 2.11   PT sec 26.2* 24.4* 24.1*   APTT sec  --  36.4  --  No results in the last week    IMAGING:   Xr Chest Portable    Result Date: 06/27/2017  EXAM: XR CHEST PORTABLE DATE: 06/27/2017 6:45 PM ACCESSION: [id number] UN DICTATED: 06/27/2017 7:10 PM INTERPRETATION LOCATION: [facility]     CLINICAL INDICATION: 61 years old Male with FEVER--      COMPARISON: None     TECHNIQUE: Portable Chest Radiograph.     FINDINGS:     Lungs are hypoinflated.     Lungs radiographically clear.     No pleural effusion or pneumothorax     Stable cardiomediastinal silhouette.                  No acute airspace disease.      MICRO:  Microbiology Results (last day)     ** No results found for the last 24 hours. **          EKG:   Encounter Date: 06/25/17   ECG 12 Lead   Result Value    EKG Systolic BP     EKG Diastolic BP     EKG Ventricular Rate 85    EKG Atrial Rate 85    EKG P-R Interval 132    EKG QRS Duration 84    EKG Q-T Interval 424    EKG QTC Calculation 504    EKG Calculated P Axis 53    EKG Calculated R Axis 4    EKG Calculated T Axis 28    Narrative    NORMAL SINUS RHYTHM  PROLONGED QT  ABNORMAL ECG  WHEN COMPARED WITH ECG OF 15-Jun-2017 17:20,  NO SIGNIFICANT CHANGE WAS FOUND  Confirmed by Gus Rankin ([id number]) on 06/27/2017 9:25:30 AM        Wilmon Pali, MD PGY1

## 2017-06-28 NOTE — Unmapped (Signed)
Problem: Patient Care Overview  Goal: Plan of Care Review  Outcome: Progressing   06/27/17    OTHER   Plan of Care Reviewed With patient   Plan of Care Review   Progress improving     Mentation improved early in shift. Able to follow directions and converse as before.Remains confused as to time and situation.Denies pain. Ate 100 % from supper tray. Temp improved after Tylenol. Otherwise vss. One bowel movements this shift.Bed alarm and Falls Precautions maintained.     Goal: Individualization and Mutuality  Outcome: Progressing   06/26/17 0510   Individualization   Patient Specific Goals (Include Timeframe) Return to baseline cognitive function prior to discharge       Problem: Fall Risk (Adult)  Goal: Identify Related Risk Factors and Signs and Symptoms  Related risk factors and signs and symptoms are identified upon initiation of Human Response Clinical Practice Guideline (CPG).   Outcome: Progressing   06/27/17    Fall Risk (Adult)   Related Risk Factors (Fall Risk) confusion/agitation;fatigue/slow reaction;environment unfamiliar   Signs and Symptoms (Fall Risk) presence of risk factors     Goal: Absence of Fall  Patient will demonstrate the desired outcomes by discharge/transition of care.   Outcome: Progressing   06/27/17    Fall Risk (Adult)   Absence of Fall making progress toward outcome       Problem: Self-Care Deficit (Adult,Obstetrics,Pediatric)  Goal: Identify Related Risk Factors and Signs and Symptoms  Related risk factors and signs and symptoms are identified upon initiation of Human Response Clinical Practice Guideline (CPG).   Outcome: Not Progressing   06/26/17 0518   Self-Care Deficit (Adult,Obstetrics,Pediatric)   Related Risk Factors (Self-Care Deficit) cognitive impairment;disease process;medication effects   Signs and Symptoms (Self-Care Deficit) cognitive changes;weakness, paresis, paralysis;decreased balance     Goal: Improved Ability to Perform BADL and IADL  Patient will demonstrate the desired outcomes by discharge/transition of care.   Outcome: Not Progressing   06/27/17    Self-Care Deficit (Adult,Obstetrics,Pediatric)   Improved Ability to Perform BADL and IADL unable to achieve outcome       Problem: Liver Failure, Acute/Chronic (Adult)  Goal: Signs and Symptoms of Listed Potential Problems Will be Absent, Minimized or Managed (Liver Failure, Acute/Chronic)  Signs and symptoms of listed potential problems will be absent, minimized or managed by discharge/transition of care (reference Liver Failure, Acute/Chronic (Adult) CPG).  Outcome: Not Progressing   06/27/17    Liver Failure, Acute/Chronic (Adult)   Problems Assessed (Liver Failure, Acute/Chronic) all   Problems Present (Liver Failure) neurologic deterioration;fluid/electrolyte imbalance

## 2017-06-28 NOTE — Unmapped (Signed)
Family Medicine Inpatient Service    Progress Note    Team: [facility] (pgr [phone])    ASSESSMENT / PLAN:   Ray Bell is a 61 y.o. male with a past medical history significant for alcoholic cirrhosis, COPD, HTN, T2DM who presents with altered mental status in the setting of not taking his medications for 5 days.    #Altered mental status - Ammonia Elevated 123 - Hepatic Encephalopathy: Improving. Having adequate number of bowel movement.    - Resume home rifaximin 550 mg BID   - Resume home lactulose 20g TID for 3-4 BMs per day   - Hold anticholinergic medications (amitriptyline)   - Delirium precautions, fall precautions  ??  # Ascites - Alcoholic Cirrhosis: MELD-Na 23, Child-Pugh Class C. 90 day mortality 14-15%. MRI 10/25 with moderate ascites. Currently decompensated and non-adherent to medications at home. Patient has a distended, tense abdomen and bilateral lower pitting edema. Sober since diagnosis (04/25/17). Followed by Gastroenterology Vallery Sa, ANP and was seen on 11/1 with the plan to perform LVP on 06/28/17.    - Consider LVP inpatient   - Continue home lactulose, rifaximin (as above)   - Continue home lasix 60 mg daily   - Continue home spironolactone 100 mg daily   - Continue home protonix 40 mg daily (EGD for variceal screening ordered on 11/1)   ??  #Macrocytic anemia: chronic, stable, asymptomatic. Presumably 2/2 liver disease. B12 and folate previously WNL. Baseline Hgb 9-10.     #COPD: stable, no home O2 requirement.   - Continue advair 100-50 mcg BID   - Albuterol nebulizer q4h PRN  ??  #T2DM: A1c 5.0% on 06/15/17  - Hold home glipizide XL 5 mg daily and metformin 1g BID  - Stop SSI - sugars have been well-controlled   ??  #HTN: stable   - Continue lasix and spironolactone as above  ??  #Depression: stable   - Continue citalopram 40 mg daily  ??  #Insomnia:   - Holding amitriptyline 100 mg nightly   - Continue trazodone 150 mg nightly  ??  #Gout:   - continue colchicine 0.6 mg daily    # FEN/GI:  - IVF None  - Check electrolytes as indicated, replete as needed.  - Diet Regular    # PPX: Held for bleeding risk     Code Status: Full code    # Dispo: Floor  [ ]  Anticipated Discharge Location: Daughter/ex-wife would like him in ALF/SNF because he is not able to perform ADLs/take medications appropriately. Daughter states patient may refuse.  [ ]  PT/OT/DME: PT/OT ordered  [ ]  CM/SW needs: CM to arrange SNF   [ ]  Meds/Rx:  Not yet prescribed. No special med needs  [ ]  Teaching: None anticipated  [ ]  Follow up appt: Appt needed  [ ]  Excuse letter: None anticipated  [ ]  Transport: Private Needed    SUBJECTIVE:  Continuing to have bowel movements. Daughter and grandson visiting today. Wants to go home. Daughter very worried about safety at home.     REVIEW OF SYSTEMS:  Pertinent positives and negatives as per interval events. A complete review of systems otherwise negative    PHYSICAL EXAM:      Intake/Output Summary (Last 24 hours) at 06/27/17 1634  Last data filed at 06/27/17 0800   Gross per 24 hour   Intake                0 ml   Output  350 ml   Net             -350 ml       Recent Vitals:  Vitals:    06/27/17 1509   BP: 149/67   Pulse: 95   Resp: 18   Temp: 36.7 ??C   SpO2: 99%       GEN: well appearing, lying in bed, NAD  Eyes: PERRL. Scleral icterus. Conjunctiva erythematous. EOMI  HEENT: NCAT, MMM.  Neck: Supple.  Lymphadenopathy: No cervical or supraclavicular LAD.  CV: Regular rate and rhythm. Systolic ejection murmur. No chostochondral tenderness. No cyanosis or clubbing. Cap Refill <3 seconds  Pulm: CTAB. No wheezing, crackles, or rhonchi.  Abd: Distended, tense. Nontender. No guarding, rebound.  Normoactive bowel sounds.    Neuro: A&O to person only. Asterixes present  Ext: 2+ bilateral lower extremity pitting edema up to knees.  Palpable distal pulses.  Skin: Mild erythema and peeling of bilateral LEs. Telangiectasias. Erythema/peeling skin on face.       LABS/ STUDIES: Recent Labs  Lab Units 06/25/17  2202 06/24/17  0942   WBC 10*9/L 5.2 5.0   HEMOGLOBIN g/dL 9.5* 9.9*   HEMATOCRIT % 29.2* 30.4*   MCV fL 104.3* 104.5*   PLATELET COUNT (1) 10*9/L 91* 92*         Recent Labs  Lab Units 06/25/17  2203 06/24/17  0942   SODIUM mmol/L 137 139   POTASSIUM mmol/L 3.6 3.9   CHLORIDE mmol/L 107 106   CO2 mmol/L 25.0 26.0   BUN mg/dL 13 12   CREATININE mg/dL 1.61* 0.96*   GLUCOSE mg/dL 045 97   CALCIUM mg/dL 8.5 8.6   ALBUMIN g/dL 2.6* 2.7*   ALT U/L 51 48   AST U/L 55 65*   ALK PHOS U/L 81 83   BILIRUBIN TOTAL mg/dL 5.9* 5.4*   PROTEIN TOTAL g/dL 6.5 6.6         Recent Labs  Lab Units 06/25/17  2202 06/24/17  0942   INR  2.14 2.11   PT sec 24.4* 24.1*   APTT sec 36.4  --        No results in the last week    IMAGING:   Ct Head Wo Contrast    Result Date: 06/26/2017  EXAM: Computed tomography, head or brain without contrast material. DATE: 06/25/2017 10:30 PM ACCESSION: [id number] UN DICTATED: 06/25/2017 10:50 PM [facility]PRET[facility] LOCATION: [facility]     CLINICAL INDICATION: 61 years old Male with AMS--      COMPARISON: None     TECHNIQUE: Axial CT images of the head  from skull base to vertex without contrast.     FINDINGS: There is no midline shift or mass lesion.  There is no evidence of intracranial hemorrhage or acute infarct. There are scattered and confluent hypodense foci within the periventricular and deep white matter.  These are nonspecific but commonly associated with small vessel ischemic changes. No fractures are evident. Partially imaged right maxillary sinus polypoid mucosal thickening.          No acute intracranial process.      MICRO:  Microbiology Results (last day)     ** No results found for the last 24 hours. **          EKG:   Encounter Date: 06/25/17   ECG 12 Lead   Result Value    EKG Systolic BP     EKG Diastolic BP     EKG Ventricular Rate 85  EKG Atrial Rate 85    EKG P-R Interval 132    EKG QRS Duration 84    EKG Q-T Interval 424    EKG QTC Calculation 504 EKG Calculated P Axis 53    EKG Calculated R Axis 4    EKG Calculated T Axis 28    Narrative    NORMAL SINUS RHYTHM  PROLONGED QT  ABNORMAL ECG  WHEN COMPARED WITH ECG OF 15-Jun-2017 17:20,  NO SIGNIFICANT CHANGE WAS FOUND  Confirmed by Gus Rankin ([id number]) on 06/27/2017 9:25:30 AM       Tamera Stands, MS4    June 26, 2017 12:59 PM    I attest that I have reviewed the student note and that the components of the history of the present illness, the physical exam, and the assessment and plan documented were performed by me or were performed in my presence by the student where I verified the documentation and performed (or re-performed) the exam and medical decision making.     Wilmon Pali, MD PGY1

## 2017-06-28 NOTE — Unmapped (Signed)
PHYSICAL THERAPY  Evaluation (06/27/17 1641)     Patient Name:  Ray Bell       Medical Record Number: [id number]   Date of Birth: 03-27-1956  Sex: Male            Treatment Diagnosis: Generalized weakness    ASSESSMENT    Ray Bell is a 61 y.o. male with a past medical history significant for alcoholic cirrhosis, COPD, HTN, T2DM who presents with altered mental status in the setting of not taking his medications properly. Pt was agreeable to work with PT, demonstrating bed mobility, transfers, and ambulation without a device. Pt demonstrated unsteadiness with ambulation with LOB, but stated his ambulation felt like his baseline. Pt also demonstrated significant tremors, which him and his daughter say is his baseline (RN still made aware). Pt demonstrated deficits in transfer independence, balance, and ambulation. Based on the AM-PAC 6 item raw score of 17/24, the patient is considered to be 43.83% impaired with basic mobility. This indicates the pt would benefit from post-acute PT 5x/week (low intensity).     Today's Interventions: AM PAC: 17/24. Pt educated re: PT role and POC, calling out for assist and not getting up by himself    Activity Tolerance: Patient tolerated treatment well    PLAN  Planned Frequency of Treatment:  1-2x per day for: 3-4x week      Planned Interventions: Balance activities;Diaphragmatic / Pursed-lip breathing;Education - Patient;Education - Family / caregiver;Endurance activities;Postural re-education;Neuromuscular re-education;Home exercise program;Gait training;Functional mobility;Self-care / Home training;Stair training;Therapeutic exercise;Therapeutic activity;Transfer training    Post-Discharge Physical Therapy Recommendations:  5x weekly;Low intensity        PT DME Recommendations: Defer to post acute (If pt d/c home, would recommend RW)     Goals:   Patient and Family Goals: Did not state    Long Term Goal #1: In 6-8 weeks, pt will return to PLOF.        SHORT GOAL #1: Pt will demonstrate all bed mobility independently.               Time Frame : 2 weeks  SHORT GOAL #2: Pt will demonstrate all functional transfers modified independently with LRAD              Time Frame : 2 weeks  SHORT GOAL #3: Pt will ambulate 168ft with modified independence and LRAD              Time Frame : 2 weeks  SHORT GOAL #4: Pt will negotiate 4 steps with bilat rails with supervision.               Time Frame : 2 weeks                      Prognosis:  Good  Barriers to Discharge: Endurance deficits;Functional strength deficits;Gait instability;Impaired balance;Decreased caregiver support;Inaccessible home environment;Poor insight into deficits  Positive Indicators: PLOF    SUBJECTIVE  Patient reports: Pt was agreeable to work with PT  Current Functional Status: Pt rec'd and left in reclined supine in bed with call bell and all needs within reach and bed alarm on. Daughter/family present in room. RN Ray Bell cleared pt for PT and made aware of outcomes.   Services patient receives: PT;OT  Prior functional status: Per pt, he ambulates without a device and denies any falls (although per daughter, when she saw him yesterday he had some blood on his face and wasn't sure if that was  from a fall). Pt's daughter drives pt as needed.   Equipment available at home: [place] Mor    Past Medical History:   Diagnosis Date   ??? Colon cancer (CMS-HCC)    ??? Diabetes mellitus (CMS-HCC)    ??? Hypertension    ??? 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 Smoking status: Never Smoker   ??? Smokeless tobacco: Never Used   ??? Alcohol use 4.8 oz/week     8 Cans of beer per week      Comment: Quit 04/25/2017; history of EtOH use disorder, drank 6 pack per day      Past Surgical History:   Procedure Laterality Date   ??? COLON SURGERY      colectomy in 2011    History reviewed. No pertinent family history.     Allergies: Venlafaxine                Objective Findings              Precautions: Falls Weight Bearing Status: Non- applicable              Required Braces or Orthoses: Non- applicable       Pain Comments: Pt did not c/o pain throughout the session   Medical Tests / Procedures: Chart reviewed.   Equipment / Environment: Vascular access (PIV, TLC, Port-a-cath, PICC)    At Rest: VSS per EPIC  With Activity: Following ambulation: R UE: BP 209/98, HR 121, L UE: 199/93 BP (HR 122). RN made aware of elevated BPs  Orthostatics: asymptomatic        Living environment: Physiological scientist  Lives With: Alone (Pt's daughter assists with meds and drives him as needed. )  Home Living: One level home;Stairs to enter with rails;Tub/shower unit;Standard height toilet  Rail placement (outside): Bilateral rails     Number of Stairs: 4    Cognition: Oriented to person, place, month (could state year (18) when given three choices (2010, 2015, 2018), and stated he was here because liver.   Visual / Perception Status: Per prior PT note: pt wears glasses for reading.   Skin Inspection: All visible skin intact    UE ROM: NT  UE Strength: NT  LE ROM: Appears WFL  LE Strength: Not formally tested, but pt was unsteady with ambulation, demonstrating functional weakness.                           Balance: Sitting balance: stand by assist. Standing balance: contact guard/minimal assist without a device. During ambulation, pt had LOB that required minimal/moderate assist to correct.          Bed Mobility: Reclined supine <--> sit with supervision. Pt was able to scoot himself up in bed with stand by assist.   Transfers: Sit <--> stand with contact guard/minimal assist with no device.    Gait: Pt ambulated ~38ft with contact guard/minimal assist and no device. Pt with tremors and unsteadiness with ambulation, having one LOB that required minimal/moderate assist to correct.    Stairs: Not assessed this session.            Eval Duration(PT): 17 Min.    Medical Staff Made Aware: RN Ellie made aware.   PT G-Codes  Functional Assessment Tool Used: AM PAC  Score: 17/24  Functional Limitation: Mobility: Walking and moving around  Mobility: Walking and Moving Around Current Status (  [id number]): At least 40 percent but less than 60 percent impaired, limited or restricted  Mobility: Walking and Moving Around Goal Status [phone]): At least 1 percent but less than 20 percent impaired, limited or restricted  Rationale: 43.83% impaired   I attest that I have reviewed the above information.  Signed: Caro Hight, PT  Filed 06/27/2017

## 2017-06-29 NOTE — Unmapped (Signed)
Family Medicine Inpatient Service    Progress Note    Team: [facility] (pgr [phone])    ASSESSMENT / PLAN:   Ray Bell is a 61 y.o. male with a past medical history significant for alcoholic cirrhosis, COPD, HTN, T2DM who presents with altered mental status in the setting of not taking his medications for 5 days. Awaiting safe dispo plan.     #Altered mental status - Ammonia Elevated 123 - Hepatic Encephalopathy: Resolved, A&Ox3. Having adequate number of bowel movement. Ammonia down to 31 on 11/4.    - Continue home rifaximin 550 mg BID, home lactulose 20g TID for 3-4 BMs per day   - Hold anticholinergic medications (amitriptyline)   - Delirium precautions, fall precautions  ??  #Ascites - Alcoholic Cirrhosis: MELD-Na 23, Child-Pugh Class C. 90 day mortality 14-15%. MRI 10/25 with moderate ascites. LVP 11/5 drained 2.1L (cytology without malignant cells; reactive mesothelial cells present, 136 nucleated cells, albumin <1, SAAG >1.1, cultures: 2+ PMNs, no organisms seen); IV albumin not indicated.     - Continue home lactulose, rifaximin (as above)   - Continue home lasix 60 mg daily, spironolactone 100 mg daily   - Continue home protonix 40 mg daily (EGD for variceal screening ordered on 11/1)    - folate and thiamine     #Seborrheic dermatitis, facial:   - ketoconazole 2% cream BID     #Macrocytic anemia: chronic, stable, asymptomatic. Presumably 2/2 liver disease. B12 and folate previously WNL. Baseline Hgb 9-10.     #COPD: stable, no home O2 requirement.   - Continue advair 100-50 mcg BID   - Albuterol nebulizer q4h PRN  ??  #T2DM: A1c 5.0% on 06/15/17  - Hold home glipizide XL 5 mg daily and metformin 1g BID  - Stop SSI - sugars have been well-controlled   ??  #HTN: stable   - Continue lasix and spironolactone as above  ??  #Depression: stable   - Decrease citalopram from 40 to 20 mg daily (QTc 504 on 11/3)  ??  #Insomnia:   - amitriptyline 50 mg nightly (decreased from 100mg )   - Continue trazodone 150 mg nightly  ??  #Gout: no pain currently    - discontinue colchicine 0.6 mg daily (not taking this daily at home)    # FEN/GI:  - IVF None  - Check electrolytes as indicated, replete as needed. Mag 400mg  daily added due to QTc 504  - Diet Na restricted     # PPX: restart Lovenox today    Code Status: Full code    # Dispo: Floor  [ ]  Anticipated Discharge Location: Daughter/ex-wife would like him in ALF/SNF because he is not able to perform ADLs/take medications appropriately. Daughter states patient may refuse.  [ ]  PT/OT/DME: PT/OT ordered: PT/OT recs 5x/low; plan for OT living skills assessment   [ ]  CM/SW needs: CM to arrange SNF   [ ]  Meds/Rx:  Not yet prescribed. No special med needs  [ ]  Teaching: None anticipated  [ ]  Follow up appt: Appt needed  [ ]  Excuse letter: None anticipated  [ ]  Transport: Private Needed    SUBJECTIVE:  States that he is feeling well and slept well. Denies abdominal pain at the site of LVP yesterday. Feels like his abdomen is less tense. He is eating full meals and having regular BMs.     REVIEW OF SYSTEMS:  Pertinent positives and negatives as per interval events. A complete review of systems otherwise  negative    PHYSICAL EXAM:      Intake/Output Summary (Last 24 hours) at 06/29/17 0614  Last data filed at 06/28/17 2143   Gross per 24 hour   Intake              960 ml   Output             1470 ml   Net             -510 ml       Recent Vitals:  Vitals:    06/29/17 0344   BP: 132/58   Pulse: 90   Resp: 18   Temp: 36.2 ??C   SpO2: 99%       GEN: well appearing, lying in bed, NAD, conversant/friendly  Eyes: PERRL. Scleral icterus. Conjunctiva erythematous. EOMI  HEENT: NCAT, MMM.  Neck: Supple.  Lymphadenopathy: No cervical or supraclavicular LAD.  CV: Regular rate and rhythm. Systolic ejection murmur. No chostochondral tenderness. No cyanosis or clubbing. Cap Refill <3 seconds  Pulm: CTAB. No wheezing, crackles, or rhonchi.  Abd: Distended, tense. Nontender. No guarding, rebound.  Normoactive bowel sounds.    Neuro: A&O to person and place. No asterixis. Tremulous when holding hands out.   Ext: 2+ bilateral lower extremity pitting edema up to knees.  Palpable distal pulses.  Skin: Mild erythema and peeling of bilateral LEs. Telangiectasias/spider angiomas on chest. Erythema/peeling skin on face (consistent with seborrheic dermatitis). Dried gauze covering LVP site.       LABS/ STUDIES:      Recent Labs  Lab Units 06/28/17  0846 06/27/17  1836 06/25/17  2202   WBC 10*9/L 5.8 7.6 5.2   HEMOGLOBIN g/dL 9.1* 36.6* 9.5*   HEMATOCRIT % 27.1* 30.5* 29.2*   MCV fL 105.8* 105.0* 104.3*   PLATELET COUNT (1) 10*9/L 76* 83* 91*         Recent Labs  Lab Units 06/28/17  0846 06/27/17  1836 06/25/17  2203   SODIUM mmol/L 140 140 137   POTASSIUM mmol/L 4.1 4.5 3.6   CHLORIDE mmol/L 109* 107 107   CO2 mmol/L 25.0 26.0 25.0   BUN mg/dL 10 8 13    CREATININE mg/dL 4.40* 3.47 4.25*   GLUCOSE mg/dL 89 956 387   CALCIUM mg/dL 7.9* 8.4* 8.5   MAGNESIUM mg/dL  --  1.6  --    ALBUMIN g/dL 2.3* 2.8* 2.6*   ALT U/L 43 47 51   AST U/L 42 57* 55   ALK PHOS U/L 59 80 81   BILIRUBIN TOTAL mg/dL 5.4* 6.5* 5.9*   PROTEIN TOTAL g/dL 5.9* 6.8 6.5         Recent Labs  Lab Units 06/28/17  0846 06/25/17  2202 06/24/17  0942   INR  2.30 2.14 2.11   PT sec 26.2* 24.4* 24.1*   APTT sec  --  36.4  --        No results in the last week    IMAGING:   Xr Chest Portable    Result Date: 06/27/2017  EXAM: XR CHEST PORTABLE DATE: 06/27/2017 6:45 PM ACCESSION: [id number] UN DICTATED: 06/27/2017 7:10 PM [facility] LOC[facility]: [facility]     CLINICAL INDICATION: 61 years old Male with FEVER--      COMPARISON: None     TECHNIQUE: Portable Chest Radiograph.     FINDINGS:     Lungs are hypoinflated.     Lungs radiographically clear.     No pleural effusion or pneumothorax  Stable cardiomediastinal silhouette.                  No acute airspace disease.      MICRO:  Microbiology Results (last day)     ** No results found for the last 24 hours. **          EKG:   Encounter Date: 06/25/17   ECG 12 Lead   Result Value    EKG Systolic BP     EKG Diastolic BP     EKG Ventricular Rate 85    EKG Atrial Rate 85    EKG P-R Interval 132    EKG QRS Duration 84    EKG Q-T Interval 424    EKG QTC Calculation 504    EKG Calculated P Axis 53    EKG Calculated R Axis 4    EKG Calculated T Axis 28    Narrative    NORMAL SINUS RHYTHM  PROLONGED QT  ABNORMAL ECG  WHEN COMPARED WITH ECG OF 15-Jun-2017 17:20,  NO SIGNIFICANT CHANGE WAS FOUND  Confirmed by Gus Rankin ([id number]) on 06/27/2017 9:25:30 AM        Wilmon Pali, MD PGY1

## 2017-06-29 NOTE — Unmapped (Signed)
Problem: Patient Care Overview  Goal: Plan of Care Review  Outcome: Progressing  Pt more alert and oriented today and is more at his baseline compared to yesterday.pt is oriented to self,place and time.He is disoriented to situation.Pt up in chair with 1 assist.He remains on lactulose.pt had LVP at bedside today with labs sent.Pt tolerated procedure well.Falls free with bed alarm on.Call bell within reach.Pt CIWA has been discontinued.No signs of withdrawals noted.Pt for possible placement to SNF.Pt resting at this time with no needs voiced.VSS.will continue to monitor.plan of care continues   06/28/17 1555   OTHER   Plan of Care Reviewed With patient   Plan of Care Review   Progress improving     Goal: Individualization and Mutuality  Outcome: Progressing   06/26/17 1433   Individualization   Patient Specific Goals (Include Timeframe) pt will remain falls free during hospitalization   Patient Specific Interventions falls prevention in place,hourly rounding,proactive toileting,call bell within reach     Goal: Discharge Needs Assessment  Outcome: Progressing   06/28/17 1555   Discharge Needs Assessment   Concerns to be Addressed no discharge needs identified   Readmission Within the Last 30 Days no previous admission in last 30 days     Goal: Interprofessional Rounds/Family Conf  Outcome: Progressing   06/28/17 1555   Interdisciplinary Rounds/Family Conf   Participants physician;patient;nursing       Problem: Fall Risk (Adult)  Intervention: Monitor/Assist with Self Care   06/28/17 1555   Interventions   Activity Assistance Provided assistance, 1 person     Intervention: Reduce Risk/Promote Restraint Free Environment   06/28/17 1555   Interventions   Safety Interventions low bed;fall reduction program maintained;bed alarm       Goal: Identify Related Risk Factors and Signs and Symptoms  Related risk factors and signs and symptoms are identified upon initiation of Human Response Clinical Practice Guideline (CPG). Outcome: Progressing   06/28/17 1555   Fall Risk (Adult)   Related Risk Factors (Fall Risk) fatigue/slow reaction;environment unfamiliar   Signs and Symptoms (Fall Risk) presence of risk factors     Goal: Absence of Fall  Patient will demonstrate the desired outcomes by discharge/transition of care.   Outcome: Progressing   06/28/17 1555   Fall Risk (Adult)   Absence of Fall making progress toward outcome       Problem: Self-Care Deficit (Adult,Obstetrics,Pediatric)  Intervention: Promote Patient Activity Focusing on Functional Independence   06/28/17 1555   Interventions   Activity Management up in chair;activity adjusted per tolerance   Activity Assistance Provided assistance, 1 person     Intervention: Support Psychosocial Response to Functional Status   06/28/17 1555   Interventions   Supportive Measures active listening utilized;verbalization of feelings encouraged   Trust Relationship/Rapport thoughts/feelings acknowledged       Goal: Identify Related Risk Factors and Signs and Symptoms  Related risk factors and signs and symptoms are identified upon initiation of Human Response Clinical Practice Guideline (CPG).   Outcome: Progressing   06/28/17 1555   Self-Care Deficit (Adult,Obstetrics,Pediatric)   Related Risk Factors (Self-Care Deficit) fatigue/weakness;disease process     Goal: Improved Ability to Perform BADL and IADL  Patient will demonstrate the desired outcomes by discharge/transition of care.   Outcome: Progressing   06/28/17 1555   Self-Care Deficit (Adult,Obstetrics,Pediatric)   Improved Ability to Perform BADL and IADL making progress toward outcome       Problem: Liver Failure, Acute/Chronic (Adult)  Intervention: Promote Neurologic Homeostasis  06/28/17 1555   Interventions   Activity Management up in chair;activity adjusted per tolerance       Goal: Signs and Symptoms of Listed Potential Problems Will be Absent, Minimized or Managed (Liver Failure, Acute/Chronic)  Signs and symptoms of listed potential problems will be absent, minimized or managed by discharge/transition of care (reference Liver Failure, Acute/Chronic (Adult) CPG).   Outcome: Progressing   06/28/17 1555   Liver Failure, Acute/Chronic (Adult)   Problems Assessed (Liver Failure, Acute/Chronic) all   Problems Present (Liver Failure) none       Problem: VTE, DVT and PE (Adult)  Intervention: Prevent/Manage Thrombi/Emboli   06/28/17 1555   Interventions   VTE Prevention/Management ambulation promoted     Intervention: Monitor/Manage Pain   06/28/17 1555   Interventions   Medication Review/Management medications reviewed   Pain Management Interventions pain management plan reviewed with patient/caregiver       Goal: Signs and Symptoms of Listed Potential Problems Will be Absent, Minimized or Managed (VTE, DVT and PE)  Signs and symptoms of listed potential problems will be absent, minimized or managed by discharge/transition of care (reference VTE, DVT and PE (Adult) CPG).   Outcome: Progressing   06/28/17 1555   VTE, DVT and PE (Adult)   Problems Assessed (VTE, DVT, PE) all   Problems Present (VTE, DVT, PE) none

## 2017-06-29 NOTE — Unmapped (Signed)
Pharmacist Discharge Note  Patient Name: Ray Bell  Reason for admission: hepatic encephalopathy  Reason for writing this note: high risk medication, barriers to adherence    Highlighted medication changes with rationale (if applicable):  - Started rifaximin 550 mg twice daily for HE prevention    - Increased lactulose to 40 mg three times daily for HE (goal 3-5 bowel movements per day)  - Increased furosemide from 20 mg daily to 60 mg daily for worsening ascites (this dose increase was recently recommended by GI in clinic)  - Decreased amitriptyline from 100 mg to 50 mg nightly due to altered mental status  - Decreased citalopram from 40 mg to 20 mg daily due to prolonged QTc >500 msec    - Stopped clonidine 0.2 mg twice daily, as blood pressures maintained at goal off of this medication   - Stopped lisinopril 20 mg daily, as blood pressures at goal off of this medication   - Stopped metformin 1000 mg twice daily due to elevated lactate and well-controlled diabetes (A1c 5%)    Medication access/Adherence:  - I have significant concerns for [name] medication adherence, as his daughter reports he often forgets to take his medications. He also demonstrates signs of dementia, which will also impede his ability to care for himself. Although skilled nursing care was strongly recommended during this admission, [name] declined and chose to go home.    Outpatient follow-up:  [ ]  medication adherence  [ ]  lactulose titration  [ ]  blood pressure, repeat A1C within 4 week(s)    Vint Pola [facility]   Clinical Pharmacist    Future Appointments  Date Time Provider Department Center   07/22/2017 2:40 PM Vikki [address] [place], ANP [facility]

## 2017-06-29 NOTE — Unmapped (Signed)
Problem: Patient Care Overview  Goal: Plan of Care Review  Outcome: Progressing  Pt alert and oriented x4. VSS with no acute episode noted during the shift. Pt tolerated medications without difficulty. Bed in lowest position, bed alarm activated. Will continue to monitor.  Goal: Individualization and Mutuality  Outcome: Progressing      Problem: Fall Risk (Adult)  Goal: Identify Related Risk Factors and Signs and Symptoms  Related risk factors and signs and symptoms are identified upon initiation of Human Response Clinical Practice Guideline (CPG).   Outcome: Progressing   06/29/17 0328   Fall Risk (Adult)   Related Risk Factors (Fall Risk) fatigue/slow reaction   Signs and Symptoms (Fall Risk) presence of risk factors       Problem: Self-Care Deficit (Adult,Obstetrics,Pediatric)  Goal: Identify Related Risk Factors and Signs and Symptoms  Related risk factors and signs and symptoms are identified upon initiation of Human Response Clinical Practice Guideline (CPG).   Outcome: Progressing      Problem: Liver Failure, Acute/Chronic (Adult)  Goal: Signs and Symptoms of Listed Potential Problems Will be Absent, Minimized or Managed (Liver Failure, Acute/Chronic)  Signs and symptoms of listed potential problems will be absent, minimized or managed by discharge/transition of care (reference Liver Failure, Acute/Chronic (Adult) CPG).   Outcome: Progressing   06/29/17 0328   Liver Failure, Acute/Chronic (Adult)   Problems Assessed (Liver Failure, Acute/Chronic) all;impaired coagulation;malnutrition   Problems Present (Liver Failure) situational response       Problem: VTE, DVT and PE (Adult)  Goal: Signs and Symptoms of Listed Potential Problems Will be Absent, Minimized or Managed (VTE, DVT and PE)  Signs and symptoms of listed potential problems will be absent, minimized or managed by discharge/transition of care (reference VTE, DVT and PE (Adult) CPG).   Outcome: Progressing   06/29/17 0328   VTE, DVT and PE (Adult) Problems Assessed (VTE, DVT, PE) all   Problems Present (VTE, DVT, PE) none

## 2017-06-30 NOTE — Unmapped (Signed)
Problem: Patient Care Overview  Goal: Individualization and Mutuality  Outcome: Progressing   06/29/17 1708   Individualization   Patient Specific Preferences Pt prefers to seat in chair with tv on   Patient Specific Goals (Include Timeframe) Pt will remain free from fall as evidence by no witness or reported falls. by discharge   Patient Specific Interventions vs monitoring, fall precautions.      Pt is alert, disoriented to time.  Pt noted to be cooperative, pleasant and aware of safety measures. Pt vss, denies SOB, using bedside commode as needed. Denies pain. Will continue to monitor.     Problem: Self-Care Deficit (Adult,Obstetrics,Pediatric)  Goal: Identify Related Risk Factors and Signs and Symptoms  Related risk factors and signs and symptoms are identified upon initiation of Human Response Clinical Practice Guideline (CPG).   Outcome: Progressing   06/29/17 1708   Self-Care Deficit (Adult,Obstetrics,Pediatric)   Related Risk Factors (Self-Care Deficit) fatigue/weakness   Signs and Symptoms (Self-Care Deficit) unable to perform bathing/hygiene tasks;gait unstable     Goal: Improved Ability to Perform BADL and IADL  Patient will demonstrate the desired outcomes by discharge/transition of care.   Outcome: Progressing   06/29/17 1708   Self-Care Deficit (Adult,Obstetrics,Pediatric)   Improved Ability to Perform BADL and IADL making progress toward outcome       Problem: Liver Failure, Acute/Chronic (Adult)  Goal: Signs and Symptoms of Listed Potential Problems Will be Absent, Minimized or Managed (Liver Failure, Acute/Chronic)  Signs and symptoms of listed potential problems will be absent, minimized or managed by discharge/transition of care (reference Liver Failure, Acute/Chronic (Adult) CPG).   Outcome: Progressing   06/29/17 1708   Liver Failure, Acute/Chronic (Adult)   Problems Assessed (Liver Failure, Acute/Chronic) all   Problems Present (Liver Failure) situational response       Problem: VTE, DVT and PE (Adult)  Goal: Signs and Symptoms of Listed Potential Problems Will be Absent, Minimized or Managed (VTE, DVT and PE)  Signs and symptoms of listed potential problems will be absent, minimized or managed by discharge/transition of care (reference VTE, DVT and PE (Adult) CPG).   Outcome: Progressing   06/29/17 1708   VTE, DVT and PE (Adult)   Problems Assessed (VTE, DVT, PE) all   Problems Present (VTE, DVT, PE) none

## 2017-06-30 NOTE — Unmapped (Signed)
Problem: Patient Care Overview  Goal: Plan of Care Review  Outcome: Progressing   06/30/17 0336   OTHER   Plan of Care Reviewed With patient   Plan of Care Review   Progress improving     Goal: Individualization and Mutuality  Outcome: Progressing   06/29/17 1708   Individualization   Patient Specific Preferences Pt prefers to seat in chair with tv on   Patient Specific Goals (Include Timeframe) Pt will remain free from fall as evidence by no witness or reported falls. by discharge   Patient Specific Interventions vs monitoring, fall precautions.        Comments: Pt alert and oriented x 3.  Pt disorieinted to time.  All care explained.  Pt continues on po lactulose. Discharge plans pending.  Will contniue to monitor.

## 2017-06-30 NOTE — Unmapped (Signed)
Care Management  Initial Transition Planning Assessment    Ray Bell??is a 61 y.o.??male??with a past medical history significant for alcoholic cirrhosis, COPD, HTN, T2DM??who presents with altered mental status in the setting of not taking his medications for 5 days              General  Care Manager assessed the patient by : In person interview with patient, Medical record review, Discussion with Clinical Care team  Orientation Level: Disoriented to time  Who provides care at home?: N/A pt voices he lives alone, relys on his dtr to come and fill his pill box and his neighbor Lennox Grumbles comes and eats with him and takes him to get groceries. Pt denies missing any meds and voices he has no issues with his pillbox     Contact/Decision Maker:    Contact Details  Contact Details: Primary Contact  Primary Contact Name: Cheree Ditto   Primary Contact Relationship: Daughter  Phone #1: [phone]  Secondary Contact Name: Delene Loll  Secondary Contact Relationship: Son  Phone #3: [phone]    Advance Directive (Medical Treatment)  Does patient have an advance directive covering medical treatment?: Patient does not have advance directive covering medical treatment.  Reason patient does not have an advance directive covering medical treatment:: Patient does not wish to complete one at this time  Surrogate decision maker appointed:: No  Reason there is not a surrogate decision maker appointed:: Patient does not wish to appoint a surrogate decision maker at this time  Information provided on advance directive:: Yes  Patient requests assistance:: No    Advance Directive (Mental Health Treatment)  Does patient have an advance directive covering mental health treatment?: Patient does not have advance directive covering mental health treatment.  Reason patient does not have an advance directive covering mental health treatment:: Patient does not wish to complete one at this time.    Patient Information:    Lives with: Alone    Type of Residence: Private residence        Location/Detail: [address] [place] [address]    Support Systems: Family Members    Responsibilities/Dependents at home?: No    Home Care services in place prior to admission?: Yes  Type of Home Care services in place prior to admission: Home health (specify)  Current Home Care provider (Name/Phone #): [facility]  [phone]            Equipment Currently Used at Home: glucometer       Currently receiving outpatient dialysis?: No       Financial Information:     Patient source of income: SSDI pt has income of 1200$ per month and states he just started getting this. Pt likely is on his 24 month wait for medicare coverage. 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     Need for financial assistance?: No       Discharge Needs Assessment:    Concerns to be Addressed: no discharge needs identified    Clinical Risk Factors: Multiple Diagnoses (Chronic)    Barriers to taking medications: No    Prior overnight hospital stay or ED visit in last 90 days: Yes    Readmission Within the Last 30 Days: no previous admission in last 30 days         Anticipated Changes Related to Illness: none    Equipment Needed After Discharge: none  Discharge Facility/Level of Care Needs:  (Continue with HH [facility])    Patient at risk for readmission?: Yes    Discharge Plan:    Screen findings are: Care Manager reviewed the plan of the patient's care with the Multidisciplinary Team. No discharge planning needs identified at this time. Care Manager will continue to manage plan and monitor patient's progress with the team.    Expected Discharge Date: 07/05/17    Expected Transfer from Critical Care:  (NA)    Patient and/or family were provided with choice of facilities / services that are available and appropriate to meet post hospital care needs?: Other (Comment) (Stiil open with [facility] per patient. last visit was 06/24/2017) Initial Assessment complete?: Yes      SW met with pt to talk about dc planning. Pt adamently refusing to go to SNF, even for short term rehab only to return home. SW advised pt that his dtr would like for him to go as she has worries as does the team and suggested pt consider making a pitstop at rehab. Pt still refusing , voices surprise to hear his dtr suggest to go to rehab.   Phone call to dtr and updated her on this conversation with dtr, suggested adult children come and talk to pt about this option and see if they can change his mind.   Will update team, overall some concern if pt is understanding risk at home?

## 2017-07-01 NOTE — Unmapped (Signed)
Family Medicine Inpatient Service    Progress Note    Team: [facility] (pgr [phone])    Hospital Day: 4    ASSESSMENT / PLAN:   Ray Bell is a 61 y.o. male with a past medical history significant for alcoholic cirrhosis, COPD, HTN, T2DM who presents with altered mental status in the setting of not taking his medications for 5 days. Awaiting safe dispo plan, patient is refusing SNF/ALF and has capacity to make that decision. Reviewed risks of going home and missing medication doses and likelihood of returning back to the hospital.      #Altered mental status - Ammonia Elevated 123 - Hepatic Encephalopathy: Resolved, A&Ox3. Having adequate number of bowel movement. Ammonia down to 31 on 11/4.    - Continue home rifaximin 550 mg BID, home lactulose 20g TID for 3-4 BMs per day   - Hold anticholinergic medications (amitriptyline)   - Delirium precautions, fall precautions  ??  #Ascites - Alcoholic Cirrhosis: MELD-Na 23, Child-Pugh Class C. 90 day mortality 14-15%. MRI 10/25 with moderate ascites. LVP 11/5 drained 2.1L (cytology without malignant cells; reactive mesothelial cells present, 136 nucleated cells, albumin <1, SAAG >1.1, cultures: 2+ PMNs, no organisms seen); IV albumin not indicated.     - Continue home lactulose, rifaximin (as above)   - Continue home lasix 60 mg daily, spironolactone 100 mg daily   - Continue home protonix 40 mg daily (EGD for variceal screening ordered on 11/1)    - folate and thiamine     # Mild cognitive impairment: MOCA score today 16/30. Has not been formally diagnosed with dementia.   - 2/5 pts on VisuoSpatial/Executive fn (Trouble with pattern, cube, and numbers on clock)  - 3/3 pts on Naming  - 5/6 pts on Attention (Completed 2 serial subtractions)  - 0/3 pts on Language  - 2/2 pts on Abstraction  - 0/5 pts on Delayed Recall  - 4/6 pts on Orientation (Says year is 4)    #Seborrheic dermatitis, facial:   - ketoconazole 2% cream BID     #Macrocytic anemia: chronic, stable, asymptomatic. Presumably 2/2 liver disease. B12 and folate previously WNL. Baseline Hgb 9-10.     #COPD: stable, no home O2 requirement.   - Continue advair 100-50 mcg BID   - Albuterol nebulizer q4h PRN  ??  #T2DM: A1c 5.0% on 06/15/17  - Hold home glipizide XL 5 mg daily and metformin 1g BID  - Stop SSI - sugars have been well-controlled   ??  #HTN: stable   - Continue lasix and spironolactone as above  ??  #Depression: stable   - Decrease citalopram from 40 to 20 mg daily (QTc 504 on 11/3)  ??  #Insomnia:   - amitriptyline 50 mg nightly (decreased from 100mg )   - Continue trazodone 150 mg nightly  ??  #Gout: no pain currently    - discontinue colchicine 0.6 mg daily (not taking this daily at home)    # FEN/GI:  - IVF None  - Check electrolytes as indicated, replete as needed. Mag 400mg  daily added due to QTc 504  - Diet Na restricted     # PPX: Lovenox    Code Status: Full code    # Dispo: Floor  [ ]  Anticipated Discharge Location: Daughter/ex-wife would like him in ALF/SNF because he is not able to perform ADLs/take medications appropriately. However patient is adamantly refusing this   [ ]  PT/OT/DME: PT/OT ordered: PT/OT recs 5x/low; plan for OT  living skills assessment   [ ]  CM/SW needs: CM to arrange SNF   [ ]  Meds/Rx:  Not yet prescribed. No special med needs  [ ]  Teaching: None anticipated  [ ]  Follow up appt: Appt needed  [ ]  Excuse letter: None anticipated  [ ]  Transport: Private Needed    SUBJECTIVE:  States that he is feeling well and slept well. Denies abdominal pain at the site of LVP yesterday. Continues to feel like his abdomen is less tense. He is eating full meals and having regular BMs.     REVIEW OF SYSTEMS:  Pertinent positives and negatives as per interval events. A complete review of systems otherwise negative    PHYSICAL EXAM:      Intake/Output Summary (Last 24 hours) at 06/30/17 1707  Last data filed at 06/30/17 1622   Gross per 24 hour   Intake              370 ml   Output 2250 ml   Net            -1880 ml       Recent Vitals:  Vitals:    06/30/17 1551   BP: (P) 131/75   Pulse: (P) 94   Resp: (P) 16   Temp: (P) 35.6 ??C   SpO2: (P) 100%       GEN: well appearing, lying in bed, NAD, conversant/friendly  Eyes: PERRL. Scleral icterus. Conjunctiva erythematous. EOMI  HEENT: NCAT, MMM.  Neck: Supple.  Lymphadenopathy: No cervical or supraclavicular LAD.  CV: Regular rate and rhythm. Systolic ejection murmur. No chostochondral tenderness. No cyanosis or clubbing. Cap Refill <3 seconds  Pulm: CTAB. No wheezing, crackles, or rhonchi.  Abd: Distended, tense. Nontender. No guarding, rebound.  Normoactive bowel sounds.    Neuro: A&O to person and place. No asterixis. Tremulous when holding hands out.   Ext: 2+ bilateral lower extremity pitting edema up to knees.  Palpable distal pulses.  Skin: Mild erythema and peeling of bilateral LEs. Telangiectasias/spider angiomas on chest. Erythema/peeling skin on face (consistent with seborrheic dermatitis). Dried gauze covering LVP site.       LABS/ STUDIES:      Recent Labs  Lab Units 06/28/17  0846 06/27/17  1836 06/25/17  2202   WBC 10*9/L 5.8 7.6 5.2   HEMOGLOBIN g/dL 9.1* 02.7* 9.5*   HEMATOCRIT % 27.1* 30.5* 29.2*   MCV fL 105.8* 105.0* 104.3*   PLATELET COUNT (1) 10*9/L 76* 83* 91*         Recent Labs  Lab Units 06/28/17  0846 06/27/17  1836 06/25/17  2203   SODIUM mmol/L 140 140 137   POTASSIUM mmol/L 4.1 4.5 3.6   CHLORIDE mmol/L 109* 107 107   CO2 mmol/L 25.0 26.0 25.0   BUN mg/dL 10 8 13    CREATININE mg/dL 2.53* 6.64 4.03*   GLUCOSE mg/dL 89 474 259   CALCIUM mg/dL 7.9* 8.4* 8.5   MAGNESIUM mg/dL  --  1.6  --    ALBUMIN g/dL 2.3* 2.8* 2.6*   ALT U/L 43 47 51   AST U/L 42 57* 55   ALK PHOS U/L 59 80 81   BILIRUBIN TOTAL mg/dL 5.4* 6.5* 5.9*   PROTEIN TOTAL g/dL 5.9* 6.8 6.5         Recent Labs  Lab Units 06/28/17  0846 06/25/17  2202 06/24/17  0942   INR  2.30 2.14 2.11   PT sec 26.2* 24.4* 24.1*   APTT sec  --  36.4  --        No results in the last week    IMAGING:   No results found.    MICRO:  Microbiology Results (last day)     ** No results found for the last 24 hours. **          EKG:   Encounter Date: 06/25/17   ECG 12 Lead   Result Value    EKG Systolic BP     EKG Diastolic BP     EKG Ventricular Rate 85    EKG Atrial Rate 85    EKG P-R Interval 132    EKG QRS Duration 84    EKG Q-T Interval 424    EKG QTC Calculation 504    EKG Calculated P Axis 53    EKG Calculated R Axis 4    EKG Calculated T Axis 28    Narrative    NORMAL SINUS RHYTHM  PROLONGED QT  ABNORMAL ECG  WHEN COMPARED WITH ECG OF 15-Jun-2017 17:20,  NO SIGNIFICANT CHANGE WAS FOUND  Confirmed by Gus Rankin ([id number]) on 06/27/2017 9:25:30 AM        Wilmon Pali, MD PGY1

## 2017-07-01 NOTE — Unmapped (Signed)
Problem: Patient Care Overview  Goal: Plan of Care Review  Outcome: Progressing  Pt. Had a BM this shift. Given lactulose with evening medication, which pt. Took. Pt. Very pleasant and cooperative this shift. No complaints of pain. Plans to discharge to SNF, pt. Refusing, wants to go home. Daughter feels pt. Not safe to go directly home. Social services aware.   Goal: Individualization and Mutuality  Outcome: Progressing   06/30/17 1904   Individualization   Patient Specific Preferences Pt prefers to sit in reclyner   Patient Specific Goals (Include Timeframe) Pt will be free from fall as evidence by no witness or reported falls by discharge.   Patient Specific Interventions vs monitoring,bed low and locked. chair alarm       Problem: Fall Risk (Adult)  Goal: Identify Related Risk Factors and Signs and Symptoms  Related risk factors and signs and symptoms are identified upon initiation of Human Response Clinical Practice Guideline (CPG).   Outcome: Progressing   06/29/17 0328   Fall Risk (Adult)   Related Risk Factors (Fall Risk) fatigue/slow reaction   Signs and Symptoms (Fall Risk) presence of risk factors       Problem: Self-Care Deficit (Adult,Obstetrics,Pediatric)  Goal: Identify Related Risk Factors and Signs and Symptoms  Related risk factors and signs and symptoms are identified upon initiation of Human Response Clinical Practice Guideline (CPG).   Outcome: Progressing   06/30/17 1904   Self-Care Deficit (Adult,Obstetrics,Pediatric)   Related Risk Factors (Self-Care Deficit) activity intolerance;fatigue/weakness   Signs and Symptoms (Self-Care Deficit) cognitive changes;decreased balance;unable to perform bathing/hygiene tasks       Problem: Liver Failure, Acute/Chronic (Adult)  Goal: Signs and Symptoms of Listed Potential Problems Will be Absent, Minimized or Managed (Liver Failure, Acute/Chronic)  Signs and symptoms of listed potential problems will be absent, minimized or managed by discharge/transition of care (reference Liver Failure, Acute/Chronic (Adult) CPG).   Outcome: Progressing   06/30/17 1904   Liver Failure, Acute/Chronic (Adult)   Problems Assessed (Liver Failure, Acute/Chronic) all   Problems Present (Liver Failure) situational response       Problem: VTE, DVT and PE (Adult)  Goal: Signs and Symptoms of Listed Potential Problems Will be Absent, Minimized or Managed (VTE, DVT and PE)  Signs and symptoms of listed potential problems will be absent, minimized or managed by discharge/transition of care (reference VTE, DVT and PE (Adult) CPG).   Outcome: Progressing   06/30/17 1904   VTE, DVT and PE (Adult)   Problems Assessed (VTE, DVT, PE) all   Problems Present (VTE, DVT, PE) none

## 2017-07-01 NOTE — Unmapped (Signed)
Problem: Patient Care Overview  Goal: Individualization and Mutuality  Outcome: Progressing   06/30/17 1904   Individualization   Patient Specific Preferences Pt prefers to sit in reclyner   Patient Specific Goals (Include Timeframe) Pt will be free from fall as evidence by no witness or reported falls by discharge.   Patient Specific Interventions vs monitoring,bed low and locked. chair alarm       Pt is Alert, disoriented to time, pleasant and cooperative noted calm during shift. Pt denies pain, denies SOB, Face eczema appears to be better than yesterday appearance.  Pt of fall precauitions.On Lactulose with NO BM this shift. Will continue to follow    Problem: Self-Care Deficit (Adult,Obstetrics,Pediatric)  Goal: Identify Related Risk Factors and Signs and Symptoms  Related risk factors and signs and symptoms are identified upon initiation of Human Response Clinical Practice Guideline (CPG).   Outcome: Progressing   06/30/17 1904   Self-Care Deficit (Adult,Obstetrics,Pediatric)   Related Risk Factors (Self-Care Deficit) activity intolerance;fatigue/weakness   Signs and Symptoms (Self-Care Deficit) cognitive changes;decreased balance;unable to perform bathing/hygiene tasks     Goal: Improved Ability to Perform BADL and IADL  Patient will demonstrate the desired outcomes by discharge/transition of care.   Outcome: Progressing   06/30/17 1904   Self-Care Deficit (Adult,Obstetrics,Pediatric)   Improved Ability to Perform BADL and IADL making progress toward outcome       Problem: Liver Failure, Acute/Chronic (Adult)  Goal: Signs and Symptoms of Listed Potential Problems Will be Absent, Minimized or Managed (Liver Failure, Acute/Chronic)  Signs and symptoms of listed potential problems will be absent, minimized or managed by discharge/transition of care (reference Liver Failure, Acute/Chronic (Adult) CPG).   Outcome: Progressing   06/30/17 1904   Liver Failure, Acute/Chronic (Adult)   Problems Assessed (Liver Failure, Acute/Chronic) all   Problems Present (Liver Failure) situational response       Problem: VTE, DVT and PE (Adult)  Goal: Signs and Symptoms of Listed Potential Problems Will be Absent, Minimized or Managed (VTE, DVT and PE)  Signs and symptoms of listed potential problems will be absent, minimized or managed by discharge/transition of care (reference VTE, DVT and PE (Adult) CPG).   Outcome: Progressing   06/30/17 1904   VTE, DVT and PE (Adult)   Problems Assessed (VTE, DVT, PE) all   Problems Present (VTE, DVT, PE) none

## 2017-07-02 NOTE — Unmapped (Signed)
PCS referral made to [facility]- [phone]) to start process for PCS program. ( this program will provide an aide up to 3 hours daily to help with activities of daily living (bathing, dressing) A RN from [employer] will call to make an home visit appt to deterimine if you qualify for the program and if so will start the process under your Medicaid program)

## 2017-07-02 NOTE — Unmapped (Signed)
Problem: Patient Care Overview  Goal: Plan of Care Review  Outcome: Progressing  Pt is alert and oriented x 3 and on room air.  Pt stated no pain today.  Pt has lovenox subcu for VTE prevention.  Pt is on falls precautions, with bed in lowest position, call bell within reach, and hourly rounding.  Family has been at the bedside today.  No other questions or concerns at this time.  Will continue to monitor.   07/01/17 1804   OTHER   Plan of Care Reviewed With patient   Plan of Care Review   Progress improving     Goal: Individualization and Mutuality  Outcome: Progressing   07/01/17 1804   Individualization   Patient Specific Preferences Pt likes OJ   Patient Specific Goals (Include Timeframe) Pt wil be free from falls during hospitalization   Patient Specific Interventions bed in lowest position, call bell within reach, falls precautions       Problem: Fall Risk (Adult)  Goal: Absence of Fall  Patient will demonstrate the desired outcomes by discharge/transition of care.   Outcome: Progressing  Bed in lowest position, hourly rounding, falls precautions, call bell within reach   07/01/17 1804   Fall Risk (Adult)   Absence of Fall making progress toward outcome       Problem: VTE, DVT and PE (Adult)  Goal: Signs and Symptoms of Listed Potential Problems Will be Absent, Minimized or Managed (VTE, DVT and PE)  Signs and symptoms of listed potential problems will be absent, minimized or managed by discharge/transition of care (reference VTE, DVT and PE (Adult) CPG).   Outcome: Progressing  lovenox subcu for VTE prevention   07/01/17 1804   VTE, DVT and PE (Adult)   Problems Assessed (VTE, DVT, PE) all   Problems Present (VTE, DVT, PE) none

## 2017-07-02 NOTE — Unmapped (Signed)
Family Medicine Inpatient Service    Progress Note    Team: [facility] (pgr [phone])    Hospital Day: 6    ASSESSMENT / PLAN:   Ray Bell is a 61 y.o. male with a past medical history significant for alcoholic cirrhosis, COPD, HTN, T2DM who presents with altered mental status in the setting of not taking his medications for 5 days. Signed forms for pt to receive more HH aide. Per daughter, plan for discharge tomorrow.    #Altered mental status - Ammonia Elevated 123 - Hepatic Encephalopathy: Resolved, A&Ox3. Most days only having 2 bowel movements/day, will up-titrating lactulose. Ammonia down to 31 on 11/4.    - Continue home rifaximin 550 mg BID  -  Increase home lactulose 20g to 40g TID for 3-4 BMs per day  -  D/c home with powder lactulose as he states that his home lactulose solution is too messy which is why he doesn't like to take it    - Delirium precautions, fall precautions  ??  #Ascites - Alcoholic Cirrhosis: MELD-Na 23, Child-Pugh Class C. 90 day mortality 14-15%. MRI 10/25 with moderate ascites. LVP 11/5 drained 2.1L (cytology without malignant cells; reactive mesothelial cells present, 136 nucleated cells, albumin <1, SAAG >1.1, cultures: 2+ PMNs, no organisms seen); IV albumin not indicated.     - Continue lactulose, rifaximin (as above)   - Continue home lasix 60 mg daily, spironolactone 100 mg daily   - Continue home protonix 40 mg daily (EGD for variceal screening ordered on 11/1)     # Mild cognitive impairment: MOCA score 16/30. Has not been formally diagnosed with dementia.     #Seborrheic dermatitis, facial:   - ketoconazole 2% cream BID     #Macrocytic anemia: chronic, stable, asymptomatic. Presumably 2/2 liver disease. B12 and folate previously WNL. Baseline Hgb 9-10.     #COPD: stable, no home O2 requirement.   - Continue advair 100-50 mcg BID   - Albuterol nebulizer q4h PRN  ??  #T2DM: A1c 5.0% on 06/15/17  - Hold home glipizide XL 5 mg daily and metformin 1g BID  - Stop SSI - sugars have been well-controlled   ??  #HTN: stable   - Continue lasix and spironolactone as above  ??  #Depression: stable   - Citalopram 20 mg daily (decreased from 40mg , QTc 504 on 11/3)  ??  #Insomnia:   - amitriptyline 50 mg nightly (decreased from 100mg )   - Continue trazodone 150 mg nightly  ??  #Gout: no pain currently; discontinue colchicine 0.6 mg daily (not taking this daily at home)    # FEN/GI:  - IVF None  - Check electrolytes as indicated, replete as needed. Mag 400mg  daily added due to QTc 504  - Diet Na restricted     # PPX: Lovenox    Code Status: Full code    # Dispo: Floor  [ ]  Anticipated Discharge Location: Daughter/ex-wife would like him in ALF/SNF because he is not able to perform ADLs/take medications appropriately. However patient is adamantly refusing this   [ ]  PT/OT/DME: PT/OT ordered: PT/OT recs 5x/low; OT living skills assessment: assistance needed for basic needs; PT recommend rolling walker   [ ]  CM/SW needs: CM to arrange SNF   [ ]  Meds/Rx:  Not yet prescribed. No special med needs  [ ]  Teaching: None anticipated  [ ]  Follow up appt: Appt needed  [ ]  Excuse letter: None anticipated  [ ]  Transport: Private Needed  SUBJECTIVE:  States that he is feeling well and slept well. Denies abdominal pain. Continues to feel like his abdomen is less tense. He is eating full meals. He had 1 BM this AM, and 2 yesterday. He is open to having someone come to his home to help him take care of his needs.     REVIEW OF SYSTEMS:  Pertinent positives and negatives as per interval events. A complete review of systems otherwise negative    PHYSICAL EXAM:      Intake/Output Summary (Last 24 hours) at 07/02/17 1936  Last data filed at 07/02/17 1445   Gross per 24 hour   Intake              610 ml   Output                0 ml   Net              610 ml       Recent Vitals:  Vitals:    07/02/17 1150   BP: 137/63   Pulse: 86   Resp: 18   Temp: 36 ??C   SpO2: 99%       GEN: well appearing, lying in bed, NAD, pleasant mood   Eyes: PERRL. Scleral icterus. Conjunctiva erythematous. EOMI  HEENT: NCAT, MMM.  Neck: Supple.  Lymphadenopathy: No cervical or supraclavicular LAD.  CV: Regular rate and rhythm. Systolic ejection murmur. No chostochondral tenderness. No cyanosis or clubbing. Cap Refill <3 seconds  Pulm: CTAB. No wheezing, crackles, or rhonchi.  Abd: Distended. Nontender. No guarding, rebound.  Normoactive bowel sounds.    Neuro: A&O to person and place. No asterixis. Tremulous when holding hands out.   Ext: 1+ bilateral lower extremity pitting edema up to knees (improved from yesterday). Palpable distal pulses.  Skin: Mild erythema and peeling of bilateral LEs. Telangiectasias/spider angiomas on chest. Erythema/peeling skin on face (consistent with seborrheic dermatitis).      LABS/ STUDIES:      Recent Labs  Lab Units 06/28/17  0846 06/27/17  1836 06/25/17  2202   WBC 10*9/L 5.8 7.6 5.2   HEMOGLOBIN g/dL 9.1* 16.1* 9.5*   HEMATOCRIT % 27.1* 30.5* 29.2*   MCV fL 105.8* 105.0* 104.3*   PLATELET COUNT (1) 10*9/L 76* 83* 91*         Recent Labs  Lab Units 06/28/17  0846 06/27/17  1836 06/25/17  2203   SODIUM mmol/L 140 140 137   POTASSIUM mmol/L 4.1 4.5 3.6   CHLORIDE mmol/L 109* 107 107   CO2 mmol/L 25.0 26.0 25.0   BUN mg/dL 10 8 13    CREATININE mg/dL 0.96* 0.45 4.09*   GLUCOSE mg/dL 89 811 914   CALCIUM mg/dL 7.9* 8.4* 8.5   MAGNESIUM mg/dL  --  1.6  --    ALBUMIN g/dL 2.3* 2.8* 2.6*   ALT U/L 43 47 51   AST U/L 42 57* 55   ALK PHOS U/L 59 80 81   BILIRUBIN TOTAL mg/dL 5.4* 6.5* 5.9*   PROTEIN TOTAL g/dL 5.9* 6.8 6.5         Recent Labs  Lab Units 06/28/17  0846 06/25/17  2202   INR  2.30 2.14   PT sec 26.2* 24.4*   APTT sec  --  36.4       No results in the last week    IMAGING:   No results found.    MICRO:  Microbiology Results (last day)     **  No results found for the last 24 hours. **          EKG:   Encounter Date: 06/25/17   ECG 12 Lead   Result Value    EKG Systolic BP     EKG Diastolic BP     EKG Ventricular Rate 85    EKG Atrial Rate 85    EKG P-R Interval 132    EKG QRS Duration 84    EKG Q-T Interval 424    EKG QTC Calculation 504    EKG Calculated P Axis 53    EKG Calculated R Axis 4    EKG Calculated T Axis 28    Narrative    NORMAL SINUS RHYTHM  PROLONGED QT  ABNORMAL ECG  WHEN COMPARED WITH ECG OF 15-Jun-2017 17:20,  NO SIGNIFICANT CHANGE WAS FOUND  Confirmed by Gus Rankin ([id number]) on 06/27/2017 9:25:30 AM        Wilmon Pali, MD PGY1

## 2017-07-02 NOTE — Unmapped (Signed)
Family Medicine Inpatient Service    Progress Note    Team: [facility] (pgr [phone])    Hospital Day: 4    ASSESSMENT / PLAN:   Ray Bell is a 61 y.o. male with a past medical history significant for alcoholic cirrhosis, COPD, HTN, T2DM who presents with altered mental status in the setting of not taking his medications for 5 days. Awaiting safe dispo plan, patient is refusing SNF/ALF and has capacity to make that decision. Reviewed risks of going home and missing medication doses and likelihood of returning back to the hospital. Continuing to discuss safety at home with his daughter (he does not have a HCPOA). Per daughter, he does not have guns/firearms at home. Discussed with daughter that options include: d/c to home with home health aide (CM sent orders for [facility] and SW to get 3 hours/day of HH through Medicaid) and increased family support to ensure he is taking his medications properly and safe at home vs his children getting legal guardianship of him to get him placement in SNF/ALF. Will consider if psych should evaluate to determine if he has capacity.     #Altered mental status - Ammonia Elevated 123 - Hepatic Encephalopathy: Resolved, A&Ox3. Having 2-3 bowel movements/day, will consider up-titrating lactulose. Ammonia down to 31 on 11/4.    - Continue home rifaximin 550 mg BID, home lactulose 20g TID for 3-4 BMs per day  -  D/c home with powder lactulose as he states that his home lactulose solution is too messy which is why he doesn't like to take it    - Delirium precautions, fall precautions  ??  #Ascites - Alcoholic Cirrhosis: MELD-Na 23, Child-Pugh Class C. 90 day mortality 14-15%. MRI 10/25 with moderate ascites. LVP 11/5 drained 2.1L (cytology without malignant cells; reactive mesothelial cells present, 136 nucleated cells, albumin <1, SAAG >1.1, cultures: 2+ PMNs, no organisms seen); IV albumin not indicated.     - Continue home lactulose, rifaximin (as above)   - Continue home lasix 60 mg daily, spironolactone 100 mg daily   - Continue home protonix 40 mg daily (EGD for variceal screening ordered on 11/1)    - folate and thiamine     # Mild cognitive impairment: MOCA score 16/30. Has not been formally diagnosed with dementia.     #Seborrheic dermatitis, facial:   - ketoconazole 2% cream BID     #Macrocytic anemia: chronic, stable, asymptomatic. Presumably 2/2 liver disease. B12 and folate previously WNL. Baseline Hgb 9-10.     #COPD: stable, no home O2 requirement.   - Continue advair 100-50 mcg BID   - Albuterol nebulizer q4h PRN  ??  #T2DM: A1c 5.0% on 06/15/17  - Hold home glipizide XL 5 mg daily and metformin 1g BID  - Stop SSI - sugars have been well-controlled   ??  #HTN: stable   - Continue lasix and spironolactone as above  ??  #Depression: stable   - Decrease citalopram from 40 to 20 mg daily (QTc 504 on 11/3)  ??  #Insomnia:   - amitriptyline 50 mg nightly (decreased from 100mg )   - Continue trazodone 150 mg nightly  ??  #Gout: no pain currently    - discontinue colchicine 0.6 mg daily (not taking this daily at home)    # FEN/GI:  - IVF None  - Check electrolytes as indicated, replete as needed. Mag 400mg  daily added due to QTc 504  - Diet Na restricted     #  PPX: Lovenox    Code Status: Full code    # Dispo: Floor  [ ]  Anticipated Discharge Location: Daughter/ex-wife would like him in ALF/SNF because he is not able to perform ADLs/take medications appropriately. However patient is adamantly refusing this   [ ]  PT/OT/DME: PT/OT ordered: PT/OT recs 5x/low; OT living skills assessment today; PT recommend rolling walker   [ ]  CM/SW needs: CM to arrange SNF   [ ]  Meds/Rx:  Not yet prescribed. No special med needs  [ ]  Teaching: None anticipated  [ ]  Follow up appt: Appt needed  [ ]  Excuse letter: None anticipated  [ ]  Transport: Private Needed    SUBJECTIVE:  States that he is feeling well and slept well. Denies abdominal pain. Continues to feel like his abdomen is less tense. He is eating full meals. He had 1 BM last night. Says that he is having regular BMs but unsure if he has had 3-4/day.     Per his daughter, he was very upset with her last night and has not been speaking with her. He did not seem like his usual pleasant self, his mood was down this AM, but improved throughout the course of the day.       REVIEW OF SYSTEMS:  Pertinent positives and negatives as per interval events. A complete review of systems otherwise negative    PHYSICAL EXAM:      Intake/Output Summary (Last 24 hours) at 07/01/17 1704  Last data filed at 07/01/17 1200   Gross per 24 hour   Intake              340 ml   Output             1300 ml   Net             -960 ml       Recent Vitals:  Vitals:    07/01/17 1258   BP: 147/78   Pulse: 92   Resp: 18   Temp: 36.4 ??C   SpO2: 97%       GEN: well appearing, lying in bed, NAD  Eyes: PERRL. Scleral icterus. Conjunctiva erythematous. EOMI  HEENT: NCAT, MMM.  Neck: Supple.  Lymphadenopathy: No cervical or supraclavicular LAD.  CV: Regular rate and rhythm. Systolic ejection murmur. No chostochondral tenderness. No cyanosis or clubbing. Cap Refill <3 seconds  Pulm: CTAB. No wheezing, crackles, or rhonchi.  Abd: Distended. Nontender. No guarding, rebound.  Normoactive bowel sounds.    Neuro: A&O to person and place. No asterixis. Tremulous when holding hands out.   Ext: 2+ bilateral lower extremity pitting edema up to knees.  Palpable distal pulses.  Skin: Mild erythema and peeling of bilateral LEs. Telangiectasias/spider angiomas on chest. Erythema/peeling skin on face (consistent with seborrheic dermatitis).      LABS/ STUDIES:      Recent Labs  Lab Units 06/28/17  0846 06/27/17  1836 06/25/17  2202   WBC 10*9/L 5.8 7.6 5.2   HEMOGLOBIN g/dL 9.1* 16.1* 9.5*   HEMATOCRIT % 27.1* 30.5* 29.2*   MCV fL 105.8* 105.0* 104.3*   PLATELET COUNT (1) 10*9/L 76* 83* 91*         Recent Labs  Lab Units 06/28/17  0846 06/27/17  1836 06/25/17  2203   SODIUM mmol/L 140 140 137   POTASSIUM mmol/L 4.1 4.5 3.6   CHLORIDE mmol/L 109* 107 107   CO2 mmol/L 25.0 26.0 25.0   BUN mg/dL 10 8 13  CREATININE mg/dL 1.61* 0.96 0.45*   GLUCOSE mg/dL 89 409 811   CALCIUM mg/dL 7.9* 8.4* 8.5   MAGNESIUM mg/dL  --  1.6  --    ALBUMIN g/dL 2.3* 2.8* 2.6*   ALT U/L 43 47 51   AST U/L 42 57* 55   ALK PHOS U/L 59 80 81   BILIRUBIN TOTAL mg/dL 5.4* 6.5* 5.9*   PROTEIN TOTAL g/dL 5.9* 6.8 6.5         Recent Labs  Lab Units 06/28/17  0846 06/25/17  2202   INR  2.30 2.14   PT sec 26.2* 24.4*   APTT sec  --  36.4       No results in the last week    IMAGING:   No results found.    MICRO:  Microbiology Results (last day)     ** No results found for the last 24 hours. **          EKG:   Encounter Date: 06/25/17   ECG 12 Lead   Result Value    EKG Systolic BP     EKG Diastolic BP     EKG Ventricular Rate 85    EKG Atrial Rate 85    EKG P-R Interval 132    EKG QRS Duration 84    EKG Q-T Interval 424    EKG QTC Calculation 504    EKG Calculated P Axis 53    EKG Calculated R Axis 4    EKG Calculated T Axis 28    Narrative    NORMAL SINUS RHYTHM  PROLONGED QT  ABNORMAL ECG  WHEN COMPARED WITH ECG OF 15-Jun-2017 17:20,  NO SIGNIFICANT CHANGE WAS FOUND  Confirmed by Gus Rankin ([id number]) on 06/27/2017 9:25:30 AM        Wilmon Pali, MD PGY1

## 2017-07-02 NOTE — Unmapped (Signed)
Problem: Patient Care Overview  Goal: Plan of Care Review  Outcome: Progressing  Admitted to room. Alert and oriented name and date and year only. Reoriented on where he is and reason for being hospitalized. Breathing normal easy and regular on room air. Verbalizes needs and responds appropriately.Slept fairly well. Bed kept at lowest position, brakes kept locked, call bell and phone kept in reach. Bed alarm utilized for safety.Patient is updated of new orders, plans, treatment and procedures. Independent in bed. Able to reposition on side to side without assistance from staff. Afebrile. Vital signs monitored by protocol.     Problem: Fall Risk (Adult)  Goal: Identify Related Risk Factors and Signs and Symptoms  Related risk factors and signs and symptoms are identified upon initiation of Human Response Clinical Practice Guideline (CPG).   Outcome: Progressing      Problem: Self-Care Deficit (Adult,Obstetrics,Pediatric)  Goal: Identify Related Risk Factors and Signs and Symptoms  Related risk factors and signs and symptoms are identified upon initiation of Human Response Clinical Practice Guideline (CPG).   Outcome: Progressing      Problem: Liver Failure, Acute/Chronic (Adult)  Goal: Signs and Symptoms of Listed Potential Problems Will be Absent, Minimized or Managed (Liver Failure, Acute/Chronic)  Signs and symptoms of listed potential problems will be absent, minimized or managed by discharge/transition of care (reference Liver Failure, Acute/Chronic (Adult) CPG).   Outcome: Progressing      Problem: VTE, DVT and PE (Adult)  Goal: Signs and Symptoms of Listed Potential Problems Will be Absent, Minimized or Managed (VTE, DVT and PE)  Signs and symptoms of listed potential problems will be absent, minimized or managed by discharge/transition of care (reference VTE, DVT and PE (Adult) CPG).   Outcome: Progressing

## 2017-07-03 MED ORDER — CITALOPRAM 20 MG TABLET
ORAL_TABLET | ORAL | 0 refills | 0 days
Start: 2017-07-03 — End: 2018-07-03

## 2017-07-03 MED ORDER — AMITRIPTYLINE 50 MG TABLET
ORAL_TABLET | Freq: Every evening | ORAL | 0 refills | 0.00000 days | Status: CP
Start: 2017-07-03 — End: 2017-07-03

## 2017-07-03 MED ORDER — LACTULOSE 20 GRAM ORAL PACKET
PACK | Freq: Three times a day (TID) | ORAL | 0 refills | 0.00000 days | Status: CP
Start: 2017-07-03 — End: 2017-08-02

## 2017-07-03 MED ORDER — AMITRIPTYLINE 50 MG TABLET: 50 mg | tablet | Freq: Every evening | 0 refills | 0 days | Status: AC

## 2017-07-03 MED ORDER — KETOCONAZOLE 2 % TOPICAL CREAM
Freq: Two times a day (BID) | TOPICAL | 11 refills | 0.00000 days | Status: CP
Start: 2017-07-03 — End: 2018-07-03

## 2017-07-03 MED FILL — KRISTALOSE/20GM/PKT: KRISTALOSE/20GM/PKT | 15 days supply | Qty: 90 | Fill #0

## 2017-07-03 MED FILL — KETOCONAZOLE/2%/CRE: KETOCONAZOLE/2%/CRE | 10 days supply | Qty: 1 | Fill #0

## 2017-07-03 MED FILL — CITALOPRAM/20MG/TABS: CITALOPRAM/20MG/TABS | 30 days supply | Qty: 30 | Fill #0

## 2017-07-03 MED FILL — FUROSEMIDE/20MG/TABS: FUROSEMIDE/20MG/TABS | 30 days supply | Qty: 90 | Fill #0

## 2017-07-03 MED FILL — AMITRIPTYLINE/50MG/TAB: AMITRIPTYLINE/50MG/TAB | 30 days supply | Qty: 30 | Fill #0

## 2017-07-03 NOTE — Unmapped (Signed)
Problem: Patient Care Overview  Goal: Plan of Care Review  Outcome: Progressing  Pt is alert and oriented x 3 and on room air.  Pt stated no pain today.  Pt has lovenox subcu for VTE prevention.  Pt is on falls precautions, with bed in lowest position, call bell within reach, and hourly rounding.  No other questions or concerns at this time.  Will continue to monitor.   07/02/17 1710   OTHER   Plan of Care Reviewed With patient   Plan of Care Review   Progress improving     Goal: Individualization and Mutuality   07/01/17 1804   Individualization   Patient Specific Preferences Pt likes OJ   Patient Specific Goals (Include Timeframe) Pt wil be free from falls during hospitalization   Patient Specific Interventions bed in lowest position, call bell within reach, falls precautions       Problem: Fall Risk (Adult)  Goal: Absence of Fall  Patient will demonstrate the desired outcomes by discharge/transition of care.   Outcome: Progressing  Pt has bed in lowest level, bed alarm is on, side rails up, we are hourly rounding on patient   07/02/17 1710   Fall Risk (Adult)   Absence of Fall making progress toward outcome       Problem: VTE, DVT and PE (Adult)  Goal: Signs and Symptoms of Listed Potential Problems Will be Absent, Minimized or Managed (VTE, DVT and PE)  Signs and symptoms of listed potential problems will be absent, minimized or managed by discharge/transition of care (reference VTE, DVT and PE (Adult) CPG).   Outcome: Progressing  Pt has lovenox subcu for VTE prevention   07/02/17 1710   VTE, DVT and PE (Adult)   Problems Assessed (VTE, DVT, PE) all   Problems Present (VTE, DVT, PE) none

## 2017-07-03 NOTE — Unmapped (Signed)
Physician Discharge Summary    Admit date: 06/25/2017    Discharge date and time: 07/03/2017, 5pm    Discharge to: Home with resumption of home health     Discharge Service: Family Medicine [facility])    Discharge Attending Physician: Bernette Redbird, MD    Discharge Diagnoses:   Hepatic encephalopathy (CMS-HCC)  Alcoholic cirrhosis   Mild cognitive impairment   HTN  Depression  Insomnia   Seborrheic dermatitis, facial   Macrocytic anemia   COPD  T2DM     Procedures:   Large volume paracentesis 06/28/17    Hospital Course:    PCP/outpatient follow-up:  [ ]  f/u medication compliance; consider electronic pill box  [ ]  f/u home BMs (lactulose increased to 40mg  TID to achieve 3-4 BMs/day; powder prescribed as pt thinks that solution is too messy)  [ ]  hepatology appt 11/29: will get second HBV (last one 10/25)  [ ]  screening EGD for varices   [ ]  radiology recommends short-term follow-up in 2 months (on 08/17/17) with MRI using cooperative patient protocol or biphasic liver CT  [ ]  holding home metformin on discharge; last A1c 5% 10/23 and good glucose control  [ ]  connect with PACE program once he gets Medicare coverage  [ ]  continue to provide resources for family ([employer]; http://[place]-rivera.info/)   [ ]  f/u depression/insomnia: Amitriptyline decreased to 50mg  due to AMS on presentation. Celexa decreased to 20mg  due to prolonged QTc    Ray Bell??is a 61 y.o.??male??with a past medical history significant for alcoholic cirrhosis, COPD, HTN, T2DM??who presented with altered mental status in the setting of not taking his medications for 5 days. AMS resolved within a day of resuming lactulose/rifaximin. Awaiting safe dispo plan during most of hospital stay.     PT/OT recommend 5x weekly/low intensity therapy. Daughter/ex-wife would like him in ALF/SNF because he is not able to perform ADLs/take medications appropriately. Patient adamantly refused to go to SNF/ALF despite reviewing risks of going home including missing medication doses and likelihood of returning back to the hospital. OT Living Skills Assessment revealed that he needs assistance to be safe and meet basic needs. Received walker during admission per PT recs. Per family wishes, decision made to d/c to home with home health aide (order sent to increase home health aide to 3x/day, will likely start in 1-2 weeks) and increased family support to ensure he is taking his medications properly and safe at home. Children will hold off on applying for guardianship.     #Altered mental status - Ammonia Elevated 123 - Hepatic Encephalopathy : Presented with AMS most likely 2/2 hepatic encephalopathy due to poor medication adherence. Patient has not taken rifaximin and lactulose medication for a week prior to admission.  Ammonia is elevated, mild scleral icterus and asterixis on exam. Electrolytes WNL, CBC is per his baseline, lactate slightly elevated (2.2) but lower than previous admission. His head CT was negative for intracranial bleed and he has no focal neurologic deficits. Resumed home rifaximin 550 mg BID, lactulose 20g TID for 3-4 BMs per day during hospital stay with rapid improvement in mental status. Initially held anticholinergic medications (amitriptyline) but resumed within several days at lower dose (50mg ). Increased lactulose to 40mg  TID to increase BMs to 3-4/day.  ??  # Ascites - Alcoholic Cirrhosis: MELD-Na 23, Child-Pugh Class C. 90 day mortality 14-15%. MRI 10/25 with moderate ascites (needs to be repeated in 2 months due to limited cooperation by patient). Decompensated on admission and non-adherent  to medications at home with a distended, tense abdomen and bilateral lower pitting edema. Sober since diagnosis (04/25/17). Followed by Gastroenterology Vallery Sa, ANP and was seen on 11/1 with the plan to perform LVP on 06/28/17. LVP during hospital stay drained 2.1L on 11/5. Cytology without malignant cells; reactive mesothelial cells present, 136 nucleated cells, albumin <1, SAAG >1.1, cultures: 2+ PMNs, no organisms seen; IV albumin not indicated.  Continued home lactulose, rifaximin (as above). Continued home lasix 60 mg daily, spironolactone 100 mg daily, protonix 40 mg daily (EGD for variceal screening ordered on 11/1). Potassium stable on admission.     #Seborrheic dermatitis, facial: Mild improvement with ketoconazole 2% cream BID.    # Mild cognitive impairment: MOCA score 16/30 during hospital stay. Has not been formally diagnosed with dementia.      Condition at Discharge: good  Discharge Medications:      Your Medication List      STOP taking these medications    cloNIDine HCl 0.2 MG tablet  Commonly known as:  CATAPRES     lactulose 10 gram/15 mL solution  Commonly known as:  CHRONULAC  Replaced by:  lactulose 20 gram packet     lisinopril 20 MG tablet  Commonly known as:  PRINIVIL,ZESTRIL     metFORMIN 1000 MG tablet  Commonly known as:  GLUCOPHAGE        START taking these medications    lactulose 20 gram packet  Commonly known as:  CEPHULAC  Take 2 packets (40 g total) by mouth Three (3) times a day.  Replaces:  lactulose 10 gram/15 mL solution        CHANGE how you take these medications    amitriptyline 50 MG tablet  Commonly known as:  ELAVIL  Take 1 tablet (50 mg total) by mouth nightly.  What changed:  ?? medication strength  ?? how much to take     citalopram 20 MG tablet  Commonly known as:  CeleXA  Take 1 tablet (20 mg total) by mouth daily.  What changed:  ?? medication strength  ?? how much to take     furosemide 20 MG tablet  Commonly known as:  LASIX  Take 3 tablets (60 mg total) by mouth daily.  What changed:  how much to take     ketoconazole 2 % shampoo  Commonly known as:  NIZORAL  Apply topically once a week.  What changed:  Another medication with the same name was added. Make sure you understand how and when to take each.     ketoconazole 2 % cream  Commonly known as:  NIZORAL  Apply 1 application topically Two (2) times a day.  What changed:  You were already taking a medication with the same name, and this prescription was added. Make sure you understand how and when to take each.        CONTINUE taking these medications    ADVAIR DISKUS 100-50 mcg/dose diskus  Generic drug:  fluticasone-salmeterol  Inhale 1 puff Two (2) times a day.     aspirin 81 MG tablet  Commonly known as:  ECOTRIN  Take 81 mg by mouth daily.     hydrocortisone 2.5 % lotion  Apply topically Two (2) times a day.     pantoprazole 40 MG tablet  Commonly known as:  PROTONIX  Take 40 mg by mouth daily.     PROAIR HFA 90 mcg/actuation inhaler  Generic drug:  albuterol  Inhale 2 puffs every four (  4) hours as needed for wheezing.     rifAXIMin 550 mg Tab  Commonly known as:  Xifaxan  Take 1 tablet (550 mg total) by mouth Two (2) times a day.     spironolactone 100 MG tablet  Commonly known as:  ALDACTONE  Take 1 tablet (100 mg total) by mouth daily.     traZODone 150 MG tablet  Commonly known as:  DESYREL  Take 150 mg by mouth nightly.            Pending Test Results:       Discharge Instructions:   Activity Instructions     Activity as tolerated             Follow Up instructions and Outpatient Referrals     Call MD for:  difficulty breathing, headache or visual disturbances       Call MD for:  extreme fatigue       Call MD for:  persistent dizziness or light-headedness       Call MD for:  persistent nausea or vomiting       Call MD for:  severe uncontrolled pain       Call MD for:  temperature >38.5 Celsius       Discharge instructions       You were seen in the hospital for altered mental status due to high ammonia levels in your blood from not taking your lactulose medicine at home. Once you were back on your home medication, your symptoms improved. You will need to continue taking your medications as prescribed at home. You will start to receive more home health nursing aide to help keep you safe at home. You received a walker during your hospital stay which was recommended to use by physical therapy.             Appointments which have been scheduled for you    Jul 09, 2017  [facility] ENDOSCOPY; WITH BIOPSY, SINGLE OR MULTIPLE with Andrey Farmer, MD  [facility]) [address]  [place] [address]  [phone]   Jul 22, 2017  2:40 PM EST  (Arrive by 2:10 PM)  RETURN ALCOHOL PSYCHOLSOCIAL with Launa Flight, ANP  [facility]) [address]  [place] [address]  [phone]         Resources and Referrals     Dan Humphreys       Wheels:  5 Fixed    Length of Need:  99 months        PCS referral made to [facility]- [phone]) to start process for PCS program. ( this program will provide an aide up to 3 hours daily to help with activities of daily living (bathing, dressing) A RN from [facility] will call to make an home visit appt to deterimine if you qualify for the program and if so will start the process under your Medicaid program)                     I spent less than 30 minutes in the discharge of this patient.    Wilmon Pali, MD PGY1

## 2017-07-03 NOTE — Unmapped (Signed)
Problem: Patient Care Overview  Goal: Plan of Care Review  Outcome: Progressing   Alert and oriented x3. Disoriented to month day and year. Reoriented. Breathing normal easy and regular on room air. Verbalizes needs and responds appropriately. Independent in bed. Minimal assistance provided. Slept fairly well. Bed kept at lowest position, brakes kept locked, call bell and phone kept in reach. Bed alarm utilized for safety.Patient is updated of new orders, plans, treatment and procedures.Afebrile. Vital signs monitored by protocol.     Problem: Fall Risk (Adult)  Goal: Identify Related Risk Factors and Signs and Symptoms  Related risk factors and signs and symptoms are identified upon initiation of Human Response Clinical Practice Guideline (CPG).   Outcome: Progressing      Problem: Self-Care Deficit (Adult,Obstetrics,Pediatric)  Goal: Identify Related Risk Factors and Signs and Symptoms  Related risk factors and signs and symptoms are identified upon initiation of Human Response Clinical Practice Guideline (CPG).   Outcome: Progressing      Problem: Liver Failure, Acute/Chronic (Adult)  Goal: Signs and Symptoms of Listed Potential Problems Will be Absent, Minimized or Managed (Liver Failure, Acute/Chronic)  Signs and symptoms of listed potential problems will be absent, minimized or managed by discharge/transition of care (reference Liver Failure, Acute/Chronic (Adult) CPG).   Outcome: Progressing      Problem: VTE, DVT and PE (Adult)  Goal: Signs and Symptoms of Listed Potential Problems Will be Absent, Minimized or Managed (VTE, DVT and PE)  Signs and symptoms of listed potential problems will be absent, minimized or managed by discharge/transition of care (reference VTE, DVT and PE (Adult) CPG).   Outcome: Progressing

## 2017-07-04 MED ORDER — FUROSEMIDE 20 MG TABLET
ORAL_TABLET | Freq: Every day | ORAL | 0 refills | 0.00000 days | Status: CP
Start: 2017-07-04 — End: 2017-08-03

## 2017-07-04 MED ORDER — CITALOPRAM 20 MG TABLET
ORAL_TABLET | Freq: Every day | ORAL | 0 refills | 0.00000 days | Status: CP
Start: 2017-07-04 — End: 2017-07-04

## 2017-07-04 NOTE — Unmapped (Signed)
Pt discharged home, PIV removed per order.  Went over discharge instructions with daughter and patient and they both voiced understanding.  No other questions or concerns at this time.  Pt got walker to take home with him.

## 2017-07-04 NOTE — Unmapped (Signed)
Problem: VTE, DVT and PE (Adult)  Goal: Signs and Symptoms of Listed Potential Problems Will be Absent, Minimized or Managed (VTE, DVT and PE)  Signs and symptoms of listed potential problems will be absent, minimized or managed by discharge/transition of care (reference VTE, DVT and PE (Adult) CPG).   Outcome: Progressing  Pt is alert and oriented x 3 and on room air. ??Pt stated no pain today. ??Pt has lovenox subcu for VTE prevention. ??Pt is on falls precautions, with bed in lowest position, call bell within reach, and hourly rounding. ??No other questions or concerns at this time. ??Will continue to monitor.   07/03/17 1759   VTE, DVT and PE (Adult)   Problems Assessed (VTE, DVT, PE) all   Problems Present (VTE, DVT, PE) none

## 2017-07-05 ENCOUNTER — Inpatient Hospital Stay
Admission: EM | Admit: 2017-07-05 | Discharge: 2017-07-08 | DRG: 443 | Disposition: A | Discharge status: Home Health | Payer: Medicaid Other | Attending: Internal Medicine | Admitting: Internal Medicine

## 2017-07-05 ENCOUNTER — Emergency Department: Payer: Medicaid Other

## 2017-07-05 ENCOUNTER — Other Ambulatory Visit: Payer: Self-pay

## 2017-07-05 DIAGNOSIS — R9082 White matter disease, unspecified: Secondary | ICD-10-CM | POA: Diagnosis present

## 2017-07-05 DIAGNOSIS — F419 Anxiety disorder, unspecified: Secondary | ICD-10-CM | POA: Diagnosis present

## 2017-07-05 DIAGNOSIS — E1140 Type 2 diabetes mellitus with diabetic neuropathy, unspecified: Secondary | ICD-10-CM | POA: Diagnosis present

## 2017-07-05 DIAGNOSIS — R402413 Glasgow coma scale score 13-15, at hospital admission: Secondary | ICD-10-CM | POA: Diagnosis present

## 2017-07-05 DIAGNOSIS — Z7982 Long term (current) use of aspirin: Secondary | ICD-10-CM

## 2017-07-05 DIAGNOSIS — Z79899 Other long term (current) drug therapy: Secondary | ICD-10-CM | POA: Diagnosis not present

## 2017-07-05 DIAGNOSIS — E7220 Disorder of urea cycle metabolism, unspecified: Secondary | ICD-10-CM | POA: Diagnosis present

## 2017-07-05 DIAGNOSIS — K7682 Hepatic encephalopathy: Secondary | ICD-10-CM | POA: Diagnosis present

## 2017-07-05 DIAGNOSIS — Z87891 Personal history of nicotine dependence: Secondary | ICD-10-CM

## 2017-07-05 DIAGNOSIS — K7290 Hepatic failure, unspecified without coma: Secondary | ICD-10-CM | POA: Diagnosis present

## 2017-07-05 DIAGNOSIS — G629 Polyneuropathy, unspecified: Secondary | ICD-10-CM | POA: Diagnosis present

## 2017-07-05 DIAGNOSIS — M1990 Unspecified osteoarthritis, unspecified site: Secondary | ICD-10-CM | POA: Diagnosis present

## 2017-07-05 DIAGNOSIS — Z792 Long term (current) use of antibiotics: Secondary | ICD-10-CM

## 2017-07-05 DIAGNOSIS — W19XXXA Unspecified fall, initial encounter: Secondary | ICD-10-CM | POA: Diagnosis present

## 2017-07-05 DIAGNOSIS — J449 Chronic obstructive pulmonary disease, unspecified: Secondary | ICD-10-CM | POA: Diagnosis present

## 2017-07-05 DIAGNOSIS — I509 Heart failure, unspecified: Secondary | ICD-10-CM | POA: Diagnosis present

## 2017-07-05 DIAGNOSIS — Z888 Allergy status to other drugs, medicaments and biological substances status: Secondary | ICD-10-CM

## 2017-07-05 DIAGNOSIS — Y92009 Unspecified place in unspecified non-institutional (private) residence as the place of occurrence of the external cause: Secondary | ICD-10-CM | POA: Diagnosis not present

## 2017-07-05 DIAGNOSIS — Z9114 Patient's other noncompliance with medication regimen: Secondary | ICD-10-CM | POA: Diagnosis not present

## 2017-07-05 DIAGNOSIS — G4700 Insomnia, unspecified: Secondary | ICD-10-CM | POA: Diagnosis present

## 2017-07-05 DIAGNOSIS — D696 Thrombocytopenia, unspecified: Secondary | ICD-10-CM | POA: Diagnosis present

## 2017-07-05 DIAGNOSIS — R4182 Altered mental status, unspecified: Secondary | ICD-10-CM

## 2017-07-05 DIAGNOSIS — Z7951 Long term (current) use of inhaled steroids: Secondary | ICD-10-CM

## 2017-07-05 DIAGNOSIS — T473X6A Underdosing of saline and osmotic laxatives, initial encounter: Secondary | ICD-10-CM | POA: Diagnosis present

## 2017-07-05 DIAGNOSIS — F1020 Alcohol dependence, uncomplicated: Secondary | ICD-10-CM | POA: Diagnosis present

## 2017-07-05 DIAGNOSIS — F0390 Unspecified dementia without behavioral disturbance: Secondary | ICD-10-CM | POA: Diagnosis present

## 2017-07-05 DIAGNOSIS — K7030 Alcoholic cirrhosis of liver without ascites: Secondary | ICD-10-CM | POA: Diagnosis present

## 2017-07-05 DIAGNOSIS — Z66 Do not resuscitate: Secondary | ICD-10-CM | POA: Diagnosis present

## 2017-07-05 DIAGNOSIS — K219 Gastro-esophageal reflux disease without esophagitis: Secondary | ICD-10-CM | POA: Diagnosis present

## 2017-07-05 DIAGNOSIS — R262 Difficulty in walking, not elsewhere classified: Secondary | ICD-10-CM | POA: Diagnosis present

## 2017-07-05 LAB — COMPREHENSIVE METABOLIC PANEL
ALBUMIN: 2.7 g/dL — AB (ref 3.5–5.0)
ALK PHOS: 79 U/L (ref 38–126)
ALT: 35 U/L (ref 17–63)
ANION GAP: 9 (ref 5–15)
AST: 53 U/L — ABNORMAL HIGH (ref 15–41)
BUN: 19 mg/dL (ref 6–20)
CALCIUM: 8.4 mg/dL — AB (ref 8.9–10.3)
CO2: 24 mmol/L (ref 22–32)
Chloride: 102 mmol/L (ref 101–111)
Creatinine, Ser: 0.81 mg/dL (ref 0.61–1.24)
GFR calc non Af Amer: >60 mL/min (ref >60)
Glucose, Bld: 201 mg/dL — ABNORMAL HIGH (ref 65–99)
POTASSIUM: 3.9 mmol/L (ref 3.5–5.1)
SODIUM: 135 mmol/L (ref 135–145)
TOTAL PROTEIN: 7 g/dL (ref 6.5–8.1)
Total Bilirubin: 5 mg/dL — ABNORMAL HIGH (ref 0.3–1.2)

## 2017-07-05 LAB — CBC
HEMATOCRIT: 28.6 % — AB (ref 40.0–52.0)
HEMOGLOBIN: 9.7 g/dL — AB (ref 13.0–18.0)
MCH: 35.4 pg — ABNORMAL HIGH (ref 26.0–34.0)
MCHC: 34 g/dL (ref 32.0–36.0)
MCV: 104.1 fL — ABNORMAL HIGH (ref 80.0–100.0)
Platelets: 100 10*3/uL — ABNORMAL LOW (ref 150–440)
RBC: 2.75 MIL/uL — AB (ref 4.40–5.90)
RDW: 20.6 % — ABNORMAL HIGH (ref 11.5–14.5)
WBC: 5 10*3/uL (ref 3.8–10.6)

## 2017-07-05 LAB — URINALYSIS, COMPLETE (UACMP) WITH MICROSCOPIC
Bacteria, UA: NONE SEEN
Bilirubin Urine: NEGATIVE
Glucose, UA: NEGATIVE mg/dL
HGB URINE DIPSTICK: NEGATIVE
Ketones, ur: NEGATIVE mg/dL
LEUKOCYTES UA: NEGATIVE
NITRITE: NEGATIVE
PH: 6 (ref 5.0–8.0)
Protein, ur: NEGATIVE mg/dL
SPECIFIC GRAVITY, URINE: 1.014 (ref 1.005–1.030)
Squamous Epithelial / LPF: NONE SEEN

## 2017-07-05 LAB — LIPASE, BLOOD: LIPASE: 36 U/L (ref 11–51)

## 2017-07-05 LAB — AMMONIA: AMMONIA: 82 umol/L — AB (ref 9–35)

## 2017-07-05 LAB — ETHANOL

## 2017-07-05 LAB — TROPONIN I

## 2017-07-05 MED ORDER — KETOCONAZOLE 2 % EX CREA
1.00 | TOPICAL_CREAM | CUTANEOUS | Status: DC
Start: 2017-07-03 — End: 2017-07-05

## 2017-07-05 MED ORDER — ALBUTEROL SULFATE HFA 108 (90 BASE) MCG/ACT IN AERS
2.00 | INHALATION_SPRAY | RESPIRATORY_TRACT | Status: DC
Start: ? — End: 2017-07-05

## 2017-07-05 MED ORDER — SPIRONOLACTONE 100 MG PO TABS
100.00 mg | ORAL_TABLET | ORAL | Status: DC
Start: 2017-07-04 — End: 2017-07-05

## 2017-07-05 MED ORDER — RIFAXIMIN 550 MG PO TABS
550.00 mg | ORAL_TABLET | ORAL | Status: DC
Start: 2017-07-03 — End: 2017-07-05

## 2017-07-05 MED ORDER — LACTULOSE 20 G PO PACK
40.00 | PACK | ORAL | Status: DC
Start: 2017-07-04 — End: 2017-07-05

## 2017-07-05 MED ORDER — LORAZEPAM 2 MG/ML IJ SOLN
1.0000 mg | Freq: Once | INTRAMUSCULAR | Status: AC
  Administered 2017-07-05: 1 mg via INTRAVENOUS
  Filled 2017-07-05: qty 1

## 2017-07-05 MED ORDER — PANTOPRAZOLE SODIUM 40 MG PO TBEC
40.00 mg | DELAYED_RELEASE_TABLET | ORAL | Status: DC
Start: 2017-07-04 — End: 2017-07-05

## 2017-07-05 MED ORDER — LACTULOSE ENEMA
300.0000 mL | Freq: Once | ORAL | Status: DC
  Filled 2017-07-05: qty 300

## 2017-07-05 MED ORDER — ENOXAPARIN SODIUM 40 MG/0.4ML ~~LOC~~ SOLN
40.00 mg | SUBCUTANEOUS | Status: DC
Start: 2017-07-04 — End: 2017-07-05

## 2017-07-05 MED ORDER — THIAMINE HCL 100 MG/ML IJ SOLN
100.0000 mg | Freq: Every day | INTRAMUSCULAR | Status: DC
  Administered 2017-07-06: 100 mg via INTRAVENOUS
  Filled 2017-07-05: qty 2

## 2017-07-05 MED ORDER — FLUTICASONE-SALMETEROL 100-50 MCG/DOSE IN AEPB
1.00 | INHALATION_SPRAY | RESPIRATORY_TRACT | Status: DC
Start: 2017-07-03 — End: 2017-07-05

## 2017-07-05 MED ORDER — FOLIC ACID 5 MG/ML IJ SOLN
1.0000 mg | Freq: Every day | INTRAMUSCULAR | Status: DC
  Administered 2017-07-06: 1 mg via INTRAVENOUS
  Filled 2017-07-05: qty 0.2

## 2017-07-05 MED ORDER — IPRATROPIUM-ALBUTEROL 0.5-2.5 (3) MG/3ML IN SOLN: 3.0000 mL | Freq: Four times a day (QID) | RESPIRATORY_TRACT | Status: DC | PRN

## 2017-07-05 MED ORDER — THIAMINE HCL 100 MG/ML IJ SOLN
Freq: Once | INTRAVENOUS | Status: AC
  Administered 2017-07-05: 17:00:00 via INTRAVENOUS
  Filled 2017-07-05: qty 1000

## 2017-07-05 MED ORDER — INSULIN ASPART 100 UNIT/ML ~~LOC~~ SOLN
0.0000 [IU] | Freq: Three times a day (TID) | SUBCUTANEOUS | Status: DC
Start: 2017-07-06 — End: 2017-07-08
  Administered 2017-07-06: 3 [IU] via SUBCUTANEOUS
  Filled 2017-07-05: qty 1

## 2017-07-05 MED ORDER — TRAZODONE HCL 150 MG PO TABS
150.00 mg | ORAL_TABLET | ORAL | Status: DC
Start: 2017-07-03 — End: 2017-07-05

## 2017-07-05 MED ORDER — INSULIN ASPART 100 UNIT/ML ~~LOC~~ SOLN: 0.0000 [IU] | Freq: Every day | SUBCUTANEOUS | Status: DC

## 2017-07-05 MED ORDER — CITALOPRAM HYDROBROMIDE 20 MG PO TABS
20.00 mg | ORAL_TABLET | ORAL | Status: DC
Start: 2017-07-04 — End: 2017-07-05

## 2017-07-05 MED ORDER — AMITRIPTYLINE HCL 50 MG PO TABS
50.00 mg | ORAL_TABLET | ORAL | Status: DC
Start: 2017-07-03 — End: 2017-07-05

## 2017-07-05 MED ORDER — GENERIC EXTERNAL MEDICATION
60.00 mg | Status: DC
Start: 2017-07-04 — End: 2017-07-05

## 2017-07-05 NOTE — ED Notes (Signed)
Pt resting on stretcher, side rails raised. Lights dimmed. Wife stepped outside temporarily.

## 2017-07-05 NOTE — ED Notes (Signed)
Wife came out stating that pt needed to defecate. Assisted pt to toilet. Pt urinated but did not defecate. Family at bedside.
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
## 2017-07-05 NOTE — ED Notes (Signed)
Pt denies CP, SOB, N&V&D. Denies any pain. Follows commands well. Answers questions but not all correctly. Given warm blanket.

## 2017-07-05 NOTE — ED Notes (Signed)
Pt taken to CT via stretcher.

## 2017-07-05 NOTE — ED Triage Notes (Signed)
Pt arrives to ED via [facility] from home for "not walking normal or acting right." EMS reports hx of cirrhosis and DM. CBG 269. EMS reports that pt didn't know what state he was in or who the president is. Pt states the president is Benjamin Stain and the year is 44. Pt alert to self and DOB. States he is in the hospital. Pt has yellow eyes which EMS reports is not normal for him. Pt has a gaze with tegaderm to LLQ, initially stated that he had fluid drawn off abd few days ago and then later when asked he states few weeks ago. Pt appears rigid in bed, shaking slightly. Denies drinking. Wife at bedside.

## 2017-07-05 NOTE — ED Notes (Signed)
Assisted pt to toilet to defecate. Placed yellow socks on pt prior to getting up.

## 2017-07-05 NOTE — ED Notes (Signed)
Family at bedside. 

## 2017-07-05 NOTE — ED Notes (Signed)
Patient is resting comfortably. 

## 2017-07-05 NOTE — ED Notes (Signed)
Pt taken to MRI via stretcher.  

## 2017-07-05 NOTE — ED Notes (Signed)
Pt returned from MRI via stretcher.

## 2017-07-05 NOTE — ED Notes (Signed)
Pt up to toilet to defecate after Dr. Cinda Quest performed a rectal exam to make sure pt was not impacted. Soft stool noted in toilet.

## 2017-07-06 ENCOUNTER — Other Ambulatory Visit: Payer: Self-pay

## 2017-07-06 LAB — GLUCOSE, CAPILLARY
GLUCOSE-CAPILLARY: 137 mg/dL — AB (ref 65–99)
GLUCOSE-CAPILLARY: 104 mg/dL — AB (ref 65–99)
Glucose-Capillary: 109 mg/dL — ABNORMAL HIGH (ref 65–99)
Glucose-Capillary: 169 mg/dL — ABNORMAL HIGH (ref 65–99)
Glucose-Capillary: 102 mg/dL — ABNORMAL HIGH (ref 65–99)

## 2017-07-06 LAB — CBC
HCT: 28.7 % — ABNORMAL LOW (ref 40.0–52.0)
Hemoglobin: 9.7 g/dL — ABNORMAL LOW (ref 13.0–18.0)
MCH: 34.8 pg — AB (ref 26.0–34.0)
MCHC: 33.8 g/dL (ref 32.0–36.0)
MCV: 102.9 fL — AB (ref 80.0–100.0)
PLATELETS: 92 10*3/uL — AB (ref 150–440)
RBC: 2.79 MIL/uL — ABNORMAL LOW (ref 4.40–5.90)
RDW: 21.1 % — AB (ref 11.5–14.5)
WBC: 3.7 10*3/uL — ABNORMAL LOW (ref 3.8–10.6)

## 2017-07-06 LAB — CREATININE, SERUM
CREATININE: 0.63 mg/dL (ref 0.61–1.24)
GFR calc Af Amer: >60 mL/min (ref >60)
GFR calc non Af Amer: >60 mL/min (ref >60)

## 2017-07-06 LAB — TROPONIN I
Troponin I: <0.03 ng/mL (ref <0.03)
Troponin I: <0.03 ng/mL (ref <0.03)

## 2017-07-06 LAB — AMMONIA: AMMONIA: 140 umol/L — AB (ref 9–35)

## 2017-07-06 MED ORDER — LORAZEPAM 1 MG PO TABS
1.0000 mg | ORAL_TABLET | ORAL | Status: DC | PRN
  Administered 2017-07-06 – 2017-07-08 (×8): 1 mg via ORAL
  Filled 2017-07-06 (×9): qty 1

## 2017-07-06 MED ORDER — ACETAMINOPHEN 325 MG PO TABS: 650.0000 mg | ORAL_TABLET | Freq: Four times a day (QID) | ORAL | Status: DC | PRN

## 2017-07-06 MED ORDER — RIFAXIMIN 550 MG PO TABS
550.0000 mg | ORAL_TABLET | Freq: Two times a day (BID) | ORAL | Status: DC
  Administered 2017-07-06 – 2017-07-08 (×6): 550 mg via ORAL
  Filled 2017-07-06 (×7): qty 1

## 2017-07-06 MED ORDER — SPIRONOLACTONE 100 MG PO TABS
100.0000 mg | ORAL_TABLET | Freq: Every day | ORAL | Status: DC
  Administered 2017-07-06 – 2017-07-07 (×2): 100 mg via ORAL
  Filled 2017-07-06: qty 4
  Filled 2017-07-06 (×2): qty 1
  Filled 2017-07-06: qty 4
  Filled 2017-07-06: qty 1

## 2017-07-06 MED ORDER — SODIUM CHLORIDE 0.9% FLUSH
3.0000 mL | Freq: Two times a day (BID) | INTRAVENOUS | Status: DC
  Administered 2017-07-06 – 2017-07-07 (×5): 3 mL via INTRAVENOUS

## 2017-07-06 MED ORDER — ENOXAPARIN SODIUM 40 MG/0.4ML ~~LOC~~ SOLN
40.0000 mg | SUBCUTANEOUS | Status: DC
  Administered 2017-07-06 – 2017-07-07 (×2): 40 mg via SUBCUTANEOUS
  Filled 2017-07-06 (×2): qty 0.4

## 2017-07-06 MED ORDER — ALBUTEROL SULFATE (2.5 MG/3ML) 0.083% IN NEBU: 3.0000 mL | INHALATION_SOLUTION | RESPIRATORY_TRACT | Status: DC | PRN

## 2017-07-06 MED ORDER — ADULT MULTIVITAMIN W/MINERALS CH
1.0000 | ORAL_TABLET | Freq: Every day | ORAL | Status: DC
  Administered 2017-07-06 – 2017-07-07 (×2): 1 via ORAL
  Filled 2017-07-06 (×2): qty 1

## 2017-07-06 MED ORDER — HYDROCODONE-ACETAMINOPHEN 5-325 MG PO TABS
1.0000 | ORAL_TABLET | ORAL | Status: DC | PRN
  Administered 2017-07-07: 2 via ORAL
  Filled 2017-07-06: qty 2

## 2017-07-06 MED ORDER — ACETAMINOPHEN 650 MG RE SUPP: 650.0000 mg | Freq: Four times a day (QID) | RECTAL | Status: DC | PRN

## 2017-07-06 MED ORDER — BOOST / RESOURCE BREEZE PO LIQD
1.0000 | Freq: Three times a day (TID) | ORAL | Status: DC
  Administered 2017-07-06 – 2017-07-07 (×4): 1 via ORAL

## 2017-07-06 MED ORDER — HYDROCORTISONE 2.5 % EX LOTN: 1.0000 "application " | TOPICAL_LOTION | Freq: Two times a day (BID) | CUTANEOUS | Status: DC

## 2017-07-06 MED ORDER — FUROSEMIDE 40 MG PO TABS
60.0000 mg | ORAL_TABLET | Freq: Every day | ORAL | Status: DC
  Administered 2017-07-06 – 2017-07-08 (×3): 60 mg via ORAL
  Filled 2017-07-06 (×2): qty 1

## 2017-07-06 MED ORDER — HYDROCORTISONE 1 % EX CREA
TOPICAL_CREAM | Freq: Two times a day (BID) | CUTANEOUS | Status: DC
  Administered 2017-07-06 – 2017-07-08 (×5): via TOPICAL
  Filled 2017-07-06: qty 28

## 2017-07-06 MED ORDER — MOMETASONE FURO-FORMOTEROL FUM 100-5 MCG/ACT IN AERO
2.0000 | INHALATION_SPRAY | Freq: Two times a day (BID) | RESPIRATORY_TRACT | Status: DC
  Administered 2017-07-06 – 2017-07-08 (×5): 2 via RESPIRATORY_TRACT
  Filled 2017-07-06: qty 8.8

## 2017-07-06 MED ORDER — ONDANSETRON HCL 4 MG PO TABS: 4.0000 mg | ORAL_TABLET | Freq: Four times a day (QID) | ORAL | Status: DC | PRN

## 2017-07-06 MED ORDER — VITAMIN B-1 100 MG PO TABS
100.0000 mg | ORAL_TABLET | Freq: Every day | ORAL | Status: DC
  Administered 2017-07-07: 100 mg via ORAL
  Filled 2017-07-06: qty 1

## 2017-07-06 MED ORDER — ASPIRIN EC 81 MG PO TBEC
81.0000 mg | DELAYED_RELEASE_TABLET | Freq: Every day | ORAL | Status: DC
  Administered 2017-07-06 – 2017-07-08 (×3): 81 mg via ORAL
  Filled 2017-07-06 (×3): qty 1

## 2017-07-06 MED ORDER — KCL IN DEXTROSE-NACL 10-5-0.45 MEQ/L-%-% IV SOLN
INTRAVENOUS | Status: DC
  Administered 2017-07-06 – 2017-07-08 (×2): via INTRAVENOUS
  Filled 2017-07-06 (×7): qty 1000

## 2017-07-06 MED ORDER — FOLIC ACID 1 MG PO TABS
1.0000 mg | ORAL_TABLET | Freq: Every day | ORAL | Status: DC
  Administered 2017-07-07: 1 mg via ORAL
  Filled 2017-07-06: qty 1

## 2017-07-06 MED ORDER — ONDANSETRON HCL 4 MG/2ML IJ SOLN: 4.0000 mg | Freq: Four times a day (QID) | INTRAMUSCULAR | Status: DC | PRN

## 2017-07-06 MED ORDER — LACTULOSE 10 GM/15ML PO SOLN
40.0000 g | Freq: Three times a day (TID) | ORAL | Status: DC
  Administered 2017-07-06 – 2017-07-07 (×4): 40 g via ORAL
  Filled 2017-07-06 (×4): qty 60

## 2017-07-06 MED ORDER — PANTOPRAZOLE SODIUM 40 MG PO TBEC
40.0000 mg | DELAYED_RELEASE_TABLET | Freq: Every day | ORAL | Status: DC
  Administered 2017-07-06 – 2017-07-08 (×3): 40 mg via ORAL
  Filled 2017-07-06 (×3): qty 1

## 2017-07-06 MED ORDER — KETOCONAZOLE 2 % EX CREA
1.0000 "application " | TOPICAL_CREAM | Freq: Two times a day (BID) | CUTANEOUS | Status: DC
  Administered 2017-07-06 – 2017-07-08 (×5): 1 via TOPICAL
  Filled 2017-07-06: qty 15

## 2017-07-06 NOTE — Progress Notes (Signed)
[place] at [place] NAME: Gregory Marquez    MR#:  [id number]  DATE OF BIRTH:  [birth year]  SUBJECTIVE:  CHIEF COMPLAINT:   Chief Complaint  Patient presents with  . Altered Mental Status   Came with altered mental status secondary to hepatic encephalopathy, did not receive lactulose enema yesterday and so today his ammonia level is even higher. Patient is confused but does not appear in any acute distress.  REVIEW OF SYSTEMS:   Due to confusion and encephalopathy patient is not able to give a review of system.  ROS  DRUG ALLERGIES:   Allergies  Allergen Reactions  . Effexor [Venlafaxine]     VITALS:  Blood pressure (!) 154/91, pulse 92, temperature 98.5 F (36.9 C), temperature source Oral, resp. rate 17, height 5\' 7"  (1.702 m), weight 78 kg (172 lb), SpO2 100 %.  PHYSICAL EXAMINATION:   GENERAL:  61 y.o.-year-old patient lying in the bed with no acute distress.  Nontoxic-appearing EYES: Pupils equal, round, reactive to light and accommodation. No scleral icterus. Extraocular muscles intact.  HEENT: Head atraumatic, normocephalic. Oropharynx and nasopharynx clear.  NECK:  Supple, no jugular venous distention. No thyroid enlargement, no tenderness.  LUNGS: Normal breath sounds bilaterally, no wheezing, rales,rhonchi or crepitation. No use of accessory muscles of respiration.  CARDIOVASCULAR: S1, S2 normal. No murmurs, rubs, or gallops.  ABDOMEN: Soft, nontender, nondistended. Bowel sounds present. No organomegaly or mass.  EXTREMITIES: No pedal edema, cyanosis, or clubbing.  NEUROLOGIC: PERRLA, opens eyes spontaneously. Gait not checked. Coarse tremors. PSYCHIATRIC: alert but confused. SKIN: No obvious rash, lesion, or ulcer.    Physical Exam LABORATORY PANEL:   CBC Recent Labs  Lab 07/06/17 0841  WBC 3.7*  HGB 9.7*  HCT 28.7*  PLT 92*    ------------------------------------------------------------------------------------------------------------------  Chemistries  Recent Labs  Lab 07/05/17 1540 07/06/17 0841  NA 135  --   K 3.9  --   CL 102  --   CO2 24  --   GLUCOSE 201*  --   BUN 19  --   CREATININE 0.81 0.63  CALCIUM 8.4*  --   AST 53*  --   ALT 35  --   ALKPHOS 79  --   BILITOT 5.0*  --    ------------------------------------------------------------------------------------------------------------------  Cardiac Enzymes Recent Labs  Lab 07/06/17 0257 07/06/17 0841  TROPONINI <0.03 <0.03   ------------------------------------------------------------------------------------------------------------------  RADIOLOGY:  Ct Head Wo Contrast  Result Date: 07/05/2017 CLINICAL DATA:  Altered mental status, ataxia EXAM: CT HEAD WITHOUT CONTRAST TECHNIQUE: Contiguous axial images were obtained from the base of the skull through the vertex without intravenous contrast. COMPARISON:  None. FINDINGS: Brain: No acute territorial infarction, hemorrhage or intracranial mass is visualized. Somewhat prominent but symmetrical hypodensity within the bilateral cerebellar white matter. Scattered periventricular and subcortical white matter hypodensity consistent with small vessel ischemic change. Mild to moderate atrophy. Vascular: No hyperdense vessels.  Carotid vessel calcification. Skull: No fracture or suspicious lesion. Sinuses/Orbits: Mild mucosal thickening in the maxillary sinuses. No acute orbital abnormality. Probable old nasal bone deformity. Other: None IMPRESSION: 1. Negative for hemorrhage or intracranial mass 2. Prominent hypodensity within the bilateral cerebellar white matter, could be due to chronic ischemia however given history, metabolic abnormality could also be considered. MRI recommended for further evaluation. 3. Small vessel ischemic changes of the bilateral white matter with atrophy. Electronically Signed    By: Donavan Foil M.D.   On: 07/05/2017 19:07   Mr Brain Lottie Dawson  Contrast  Result Date: 07/05/2017 CLINICAL DATA:  Altered mental status EXAM: MRI HEAD WITHOUT CONTRAST TECHNIQUE: Multiplanar, multiecho pulse sequences of the brain and surrounding structures were obtained without intravenous contrast. COMPARISON:  Head CT 07/05/2017 FINDINGS: The study is severely degraded by motion. Much of the examination is nondiagnostic despite attempts to reduce artifact with motion resistant sequences. Within that limitation, there is no large area of acute ischemia and there is no midline shift or other mass effect. The areas of potential abnormality on the CT cannot be adequately visualized. IMPRESSION: 1. Severely motion degraded examination. 2. Within that limitation, no large area of acute ischemia or midline shift. 3. The examination is nondiagnostic for assessment of toxic metabolic processes. Electronically Signed   By: Ulyses Jarred M.D.   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  Noncompliance with medication administration as patient lives alone with dementia/mild cognitive impairment Recent discharge from [facility], status post paracentesis November fifth 2018, plans for hip pathology outpatient appointment November 29  lactulose 40 mg 3 times daily thereafter, neuro checks per routine, aspiration/fall/skin care precautions, case management to help with disposition planning given frailty/demented state  Will also add Ativan PRN for anxiety.  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 High bilirubin   Due to liver cirrhosis    Monitor.  6. Thrombocytopenia   Avoid Anticoagulants, DVT prophylaxis with TED and SCD.   All the records are reviewed and case discussed with Care Management/Social Workerr. Management plans discussed with the patient, family and they are in agreement.  CODE STATUS: DNR  TOTAL TIME TAKING CARE OF THIS PATIENT: 35 minutes.   POSSIBLE D/C IN 1-2 DAYS, DEPENDING ON CLINICAL CONDITION.   Vaughan Basta M.D on 07/06/2017   Between 7am to 6pm - Pager - [phone]  After 6pm go to [email or web address] - password [email or web address] [place] [facility]  Office  [phone]  CC: Primary care physician; Center, [facility]  Note: This dictation was prepared with Dragon dictation along with smaller phrase technology. Any transcriptional errors that result from this process are unintentional.

## 2017-07-06 NOTE — Care Management (Signed)
RNCM rounded on patient but he was sleeping and I did not wake.  Home health list with my contact information left at bedside.  RNCM will follow. No family currently at bedside.

## 2017-07-06 NOTE — ED Notes (Signed)
Patient is resting comfortably. 

## 2017-07-06 NOTE — Progress Notes (Signed)
Initial Nutrition Assessment  DOCUMENTATION CODES:   Non-severe (moderate) malnutrition in context of chronic illness  INTERVENTION:  Provide Boost Breeze po TID, each supplement provides 250 kcal and 9 grams of protein.  Recommend Ensure Enlive po BID with diet advancement, each supplement provides 350 kcal and 20 grams of protein.  Continue MVI daily, folic acid 1 mg daily, thiamine 100 mg daily.  NUTRITION DIAGNOSIS:   Moderate Malnutrition related to chronic illness(EtOH abuse, liver cirrhosis) as evidenced by moderate fat depletion, moderate muscle depletion.  GOAL:   Patient will meet greater than or equal to 90% of their needs  MONITOR:   PO intake, Supplement acceptance, Diet advancement, Labs, Weight trends, I & O's  REASON FOR ASSESSMENT:   Malnutrition Screening Tool    ASSESSMENT:   61 year old male with PMHx of thrombocytopenia, diabetes, peripheral neuropathy, vitamin D deficiency, EtOH abuse, CHF, COPD, gout, HLD, anxiety, GERD, hx colon cancer s/p colectomy 12/2009, chronic dementia/mild cognitive impairment, alcoholic liver cirrhosis s/p recent paracentesis 11/5 now admitted with acute recurrent hepatic encephalopathy.   Met with patient in his room. He woke up, but was very confused and unable to provide a good history. He only reports he is not eating well. No family members present.  Limited weight history in chart. Weight from 06/24/2017 at an outpatient visit was 204.4 lbs, however it was documented that patient had ascites. RD obtained bed scale weight of 178.6 lbs today.  Medications reviewed and include: folic acid 1 mg daily, Lasix 60 mg daily, Novolog 0-15 units TID, Novolog 0-5 units QHS, lactulose 40 grams TID, MVI daily, pantoprazole, spironolactone 100 mg daily, thiamine 100 mg daily PO, D5-1/2NS with KCl 10 mEq/L at 75 mL/hr (90 grams dextrose, 306 kcal daily).  Labs reviewed: CBG 109-169, Ammonia 140  NUTRITION - FOCUSED PHYSICAL EXAM:   Most Recent Value  Orbital Region  Mild depletion  Upper Arm Region  Moderate depletion  Thoracic and Lumbar Region  Moderate depletion  Buccal Region  Mild depletion  Temple Region  Mild depletion  Clavicle Bone Region  Moderate depletion  Clavicle and Acromion Bone Region  Moderate depletion  Scapular Bone Region  Moderate depletion  Dorsal Hand  Moderate depletion  Patellar Region  Moderate depletion  Anterior Thigh Region  Moderate depletion  Posterior Calf Region  Moderate depletion  Edema (RD Assessment)  None  Hair  Reviewed  Eyes  Reviewed [jaundice]  Mouth  Reviewed  Skin  Reviewed  Nails  Reviewed     Diet Order:  Diet clear liquid Room service appropriate? Yes; Fluid consistency: Thin  EDUCATION NEEDS:   Not appropriate for education at this time  Skin:  Skin Assessment: Reviewed RN Assessment  Last BM:  07/05/2017  Height:   Ht Readings from Last 1 Encounters:  07/05/17 5' 7" (1.702 m)    Weight:   Wt Readings from Last 1 Encounters:  07/06/17 178 lb 9.6 oz (81 kg)    Ideal Body Weight:  67.3 kg  BMI:  Body mass index is 27.97 kg/m.  Estimated Nutritional Needs:   Kcal:  9563-8756 (MSJ x 1.2-1.3)  Protein:  95-115 grams (1.2-1.4 grams/kg)  Fluid:  1.8-2 L/day (1 mL/kcal)  Willey Blade, MS, RD, LDN Office: [phone] Pager: [phone] After Hours/Weekend Pager: [phone]

## 2017-07-07 LAB — CBC
HCT: 29 % — ABNORMAL LOW (ref 40.0–52.0)
HEMOGLOBIN: 9.9 g/dL — AB (ref 13.0–18.0)
MCH: 34.9 pg — AB (ref 26.0–34.0)
MCHC: 34.1 g/dL (ref 32.0–36.0)
MCV: 102.4 fL — ABNORMAL HIGH (ref 80.0–100.0)
PLATELETS: 100 10*3/uL — AB (ref 150–440)
RBC: 2.83 MIL/uL — AB (ref 4.40–5.90)
RDW: 20.4 % — ABNORMAL HIGH (ref 11.5–14.5)
WBC: 5 10*3/uL (ref 3.8–10.6)

## 2017-07-07 LAB — COMPREHENSIVE METABOLIC PANEL
ALK PHOS: 68 U/L (ref 38–126)
ALT: 34 U/L (ref 17–63)
ANION GAP: 7 (ref 5–15)
AST: 49 U/L — ABNORMAL HIGH (ref 15–41)
Albumin: 2.4 g/dL — ABNORMAL LOW (ref 3.5–5.0)
BILIRUBIN TOTAL: 6 mg/dL — AB (ref 0.3–1.2)
BUN: 11 mg/dL (ref 6–20)
CALCIUM: 8.4 mg/dL — AB (ref 8.9–10.3)
CO2: 22 mmol/L (ref 22–32)
CREATININE: 0.65 mg/dL (ref 0.61–1.24)
Chloride: 107 mmol/L (ref 101–111)
Glucose, Bld: 118 mg/dL — ABNORMAL HIGH (ref 65–99)
Potassium: 3.8 mmol/L (ref 3.5–5.1)
Sodium: 136 mmol/L (ref 135–145)
TOTAL PROTEIN: 6.3 g/dL — AB (ref 6.5–8.1)

## 2017-07-07 LAB — GLUCOSE, CAPILLARY
GLUCOSE-CAPILLARY: 92 mg/dL (ref 65–99)
GLUCOSE-CAPILLARY: 91 mg/dL (ref 65–99)
Glucose-Capillary: 111 mg/dL — ABNORMAL HIGH (ref 65–99)
Glucose-Capillary: 113 mg/dL — ABNORMAL HIGH (ref 65–99)

## 2017-07-07 LAB — AMMONIA: AMMONIA: 69 umol/L — AB (ref 9–35)

## 2017-07-07 LAB — HIV ANTIBODY (ROUTINE TESTING W REFLEX): HIV Screen 4th Generation wRfx: NONREACTIVE

## 2017-07-07 MED ORDER — LACTULOSE 10 GM/15ML PO SOLN
40.0000 g | Freq: Two times a day (BID) | ORAL | Status: DC
  Administered 2017-07-07: 40 g via ORAL
  Filled 2017-07-07: qty 60

## 2017-07-07 NOTE — NC FL2 (Signed)
[place] LEVEL OF CARE SCREENING TOOL     IDENTIFICATION  Patient Name: Gregory Marquez Birthdate: 04/26/1956 Sex: male Admission Date (Current Location): 07/05/2017  [facility] and [place] Number:  Selena Lesser ([id number]) Facility and Address:  [facility], [address] [place], [place] [address]      Provider Number: [id number]  Attending Physician Name and Address:  Vaughan Basta, *  Relative Name and Phone Number:       Current Level of Care: Hospital Recommended Level of Care: [place] Prior Approval Number:    Date Approved/Denied:   PASRR Number: [id number] )  Discharge Plan: SNF    Current Diagnoses: Patient Active Problem List   Diagnosis Date Noted  . Hepatic encephalopathy ([place]) 07/05/2017    Orientation RESPIRATION BLADDER Height & Weight     Self  Normal Incontinent Weight: 178 lb 9.6 oz (81 kg)(bed scale) Height:  5\' 7"  (170.2 cm)  BEHAVIORAL SYMPTOMS/MOOD NEUROLOGICAL BOWEL NUTRITION STATUS      Incontinent Diet(Diet: Clear Liquid to be Advanced. )  AMBULATORY STATUS COMMUNICATION OF NEEDS Skin   Extensive Assist Verbally Normal                       Personal Care Assistance Level of Assistance  Bathing, Feeding, Dressing Bathing Assistance: Limited assistance Feeding assistance: Limited assistance Dressing Assistance: Limited assistance     Functional Limitations Info  Sight, Hearing, Speech Sight Info: Adequate Hearing Info: Adequate Speech Info: Adequate    SPECIAL CARE FACTORS FREQUENCY  PT (By licensed PT)     PT Frequency: (4-5)              Contractures      Additional Factors Info  Code Status, Allergies Code Status Info: (DNR ) Allergies Info: (Effexor Venlafaxine)           Current Medications (07/07/2017):  This is the current hospital active medication list Current Facility-Administered Medications  Medication Dose Route Frequency  Provider Last Rate Last Dose  . acetaminophen (TYLENOL) tablet 650 mg  650 mg Oral Q6H PRN Salary, Montell D, MD       Or  . acetaminophen (TYLENOL) suppository 650 mg  650 mg Rectal Q6H PRN Salary, Montell D, MD      . aspirin EC tablet 81 mg  81 mg Oral Daily Salary, Montell D, MD   81 mg at 07/07/17 0929  . dextrose 5 % and 0.45 % NaCl with KCl 10 mEq/L infusion   Intravenous Continuous Salary, Holly Bodily D, MD 75 mL/hr at 07/06/17 0456    . enoxaparin (LOVENOX) injection 40 mg  40 mg Subcutaneous Q24H Salary, Holly Bodily D, MD   40 mg at 07/06/17 2049  . feeding supplement (BOOST / RESOURCE BREEZE) liquid 1 Container  1 Container Oral TID BM Vaughan Basta, MD   1 Container at 57/01/77 9390  . folic acid (FOLVITE) tablet 1 mg  1 mg Oral Daily Vaughan Basta, MD   1 mg at 07/07/17 0929  . furosemide (LASIX) tablet 60 mg  60 mg Oral Daily Salary, Montell D, MD   60 mg at 07/07/17 0930  . HYDROcodone-acetaminophen (NORCO/VICODIN) 5-325 MG per tablet 1-2 tablet  1-2 tablet Oral Q4H PRN Salary, Montell D, MD      . hydrocortisone cream 1 %   Topical BID Salary, Montell D, MD      . insulin aspart (novoLOG) injection 0-15 Units  0-15 Units Subcutaneous TID WC Salary,  Avel Peace, MD   3 Units at 07/06/17 1216  . insulin aspart (novoLOG) injection 0-5 Units  0-5 Units Subcutaneous QHS Salary, Montell D, MD      . ipratropium-albuterol (DUONEB) 0.5-2.5 (3) MG/3ML nebulizer solution 3 mL  3 mL Nebulization Q6H PRN Salary, Montell D, MD      . ketoconazole (NIZORAL) 2 % cream 1 application  1 application Topical BID Salary, Avel Peace, MD   1 application at 97/58/83 0931  . lactulose (CHRONULAC) 10 GM/15ML solution 40 g  40 g Oral BID Vaughan Basta, MD      . LORazepam (ATIVAN) tablet 1 mg  1 mg Oral Q4H PRN Vaughan Basta, MD   1 mg at 07/07/17 0929  . mometasone-formoterol (DULERA) 100-5 MCG/ACT inhaler 2 puff  2 puff Inhalation BID Loney Hering D, MD   2 puff at 07/07/17 0931   . multivitamin with minerals tablet 1 tablet  1 tablet Oral Daily Salary, Avel Peace, MD   1 tablet at 07/07/17 0929  . ondansetron (ZOFRAN) tablet 4 mg  4 mg Oral Q6H PRN Salary, Montell D, MD       Or  . ondansetron (ZOFRAN) injection 4 mg  4 mg Intravenous Q6H PRN Salary, Montell D, MD      . pantoprazole (PROTONIX) EC tablet 40 mg  40 mg Oral Daily Salary, Montell D, MD   40 mg at 07/07/17 0932  . rifaximin (XIFAXAN) tablet 550 mg  550 mg Oral BID Salary, Holly Bodily D, MD   550 mg at 07/07/17 0930  . sodium chloride flush (NS) 0.9 % injection 3 mL  3 mL Intravenous Q12H Salary, Montell D, MD   3 mL at 07/07/17 1219  . spironolactone (ALDACTONE) tablet 100 mg  100 mg Oral Daily Salary, Montell D, MD   100 mg at 07/07/17 0929  . thiamine (VITAMIN B-1) tablet 100 mg  100 mg Oral Daily Vaughan Basta, MD   100 mg at 07/07/17 0930     Discharge Medications: Please see discharge summary for a list of discharge medications.  Relevant Imaging Results:  Relevant Lab Results:   Additional Information (SSN: [id number])  Junella Domke, Veronia Beets, LCSW

## 2017-07-07 NOTE — Progress Notes (Signed)
Gregory Marquez at [place] NAME: Gregory Marquez    MR#:  [id number]  DATE OF BIRTH:  05-05-1956  SUBJECTIVE:  CHIEF COMPLAINT:   Chief Complaint  Patient presents with  . Altered Mental Status   Came with altered mental status secondary to hepatic encephalopathy,  his ammonia level is  higher. Patient is confused but does not appear in any acute distress.  More alert today.  REVIEW OF SYSTEMS:   Due to confusion and encephalopathy patient is not able to give a review of system.  ROS  DRUG ALLERGIES:   Allergies  Allergen Reactions  . Effexor [Venlafaxine]     VITALS:  Blood pressure (!) 141/65, pulse 100, temperature 98.1 F (36.7 C), temperature source Oral, resp. rate 20, height 5\' 7"  (1.702 m), weight 81 kg (178 lb 9.6 oz), SpO2 100 %.  PHYSICAL EXAMINATION:   GENERAL:  61 y.o.-year-old patient lying in the bed with no acute distress.  Nontoxic-appearing EYES: Pupils equal, round, reactive to light and accommodation. No scleral icterus. Extraocular muscles intact.  HEENT: Head atraumatic, normocephalic. Oropharynx and nasopharynx clear.  NECK:  Supple, no jugular venous distention. No thyroid enlargement, no tenderness.  LUNGS: Normal breath sounds bilaterally, no wheezing, rales,rhonchi or crepitation. No use of accessory muscles of respiration.  CARDIOVASCULAR: S1, S2 normal. No murmurs, rubs, or gallops.  ABDOMEN: Soft, nontender, nondistended. Bowel sounds present. No organomegaly or mass.  EXTREMITIES: No pedal edema, cyanosis, or clubbing.  NEUROLOGIC: PERRLA, opens eyes spontaneously. Gait not checked. Coarse tremors. PSYCHIATRIC: alert , oriented X2. SKIN: No obvious rash, lesion, or ulcer.    Physical Exam LABORATORY PANEL:   CBC Recent Labs  Lab 07/07/17 0409  WBC 5.0  HGB 9.9*  HCT 29.0*  PLT 100*    ------------------------------------------------------------------------------------------------------------------  Chemistries  Recent Labs  Lab 07/07/17 0409  NA 136  K 3.8  CL 107  CO2 22  GLUCOSE 118*  BUN 11  CREATININE 0.65  CALCIUM 8.4*  AST 49*  ALT 34  ALKPHOS 68  BILITOT 6.0*   ------------------------------------------------------------------------------------------------------------------  Cardiac Enzymes Recent Labs  Lab 07/06/17 0841 07/06/17 1611  TROPONINI <0.03 <0.03   ------------------------------------------------------------------------------------------------------------------  RADIOLOGY:  Ct Head Wo Contrast  Result Date: 07/05/2017 CLINICAL DATA:  Altered mental status, ataxia EXAM: CT HEAD WITHOUT CONTRAST TECHNIQUE: Contiguous axial images were obtained from the base of the skull through the vertex without intravenous contrast. COMPARISON:  None. FINDINGS: Brain: No acute territorial infarction, hemorrhage or intracranial mass is visualized. Somewhat prominent but symmetrical hypodensity within the bilateral cerebellar white matter. Scattered periventricular and subcortical white matter hypodensity consistent with small vessel ischemic change. Mild to moderate atrophy. Vascular: No hyperdense vessels.  Carotid vessel calcification. Skull: No fracture or suspicious lesion. Sinuses/Orbits: Mild mucosal thickening in the maxillary sinuses. No acute orbital abnormality. Probable old nasal bone deformity. Other: None IMPRESSION: 1. Negative for hemorrhage or intracranial mass 2. Prominent hypodensity within the bilateral cerebellar white matter, could be due to chronic ischemia however given history, metabolic abnormality could also be considered. MRI recommended for further evaluation. 3. Small vessel ischemic changes of the bilateral white matter with atrophy. Electronically Signed   By: Donavan Foil M.D.   On: 07/05/2017 19:07   Mr Brain Wo  Contrast  Result Date: 07/05/2017 CLINICAL DATA:  Altered mental status EXAM: MRI HEAD WITHOUT CONTRAST TECHNIQUE: Multiplanar, multiecho pulse sequences of the brain and surrounding structures were obtained without intravenous contrast. COMPARISON:  Head CT 07/05/2017  FINDINGS: The study is severely degraded by motion. Much of the examination is nondiagnostic despite attempts to reduce artifact with motion resistant sequences. Within that limitation, there is no large area of acute ischemia and there is no midline shift or other mass effect. The areas of potential abnormality on the CT cannot be adequately visualized. IMPRESSION: 1. Severely motion degraded examination. 2. Within that limitation, no large area of acute ischemia or midline shift. 3. The examination is nondiagnostic for assessment of toxic metabolic processes. Electronically Signed   By: Ulyses Jarred M.D.   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  Noncompliance with medication administration as patient lives alone with dementia/mild cognitive impairment Recent discharge from [facility], status post paracentesis November fifth 2018, plans for outpatient appointment November 29, as per wife, in that admission Ascites tap was done, no infections found.  lactulose 40 mg 3 times daily thereafter, neuro checks per routine, aspiration/fall/skin care precautions, case management to help with disposition planning given frailty/demented state  Will also add Ativan PRN for anxiety. Ammonia level came down some.  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 High bilirubin   Due to liver cirrhosis    Monitor.  6. Thrombocytopenia   Avoid Anticoagulants, DVT prophylaxis with TED and SCD.   All the records are reviewed and case discussed with Care Management/Social Workerr. Management plans discussed with the patient, family and they are in agreement.  CODE STATUS: DNR  TOTAL TIME TAKING CARE OF THIS PATIENT: 35 minutes.   POSSIBLE D/C IN 1-2 DAYS, DEPENDING ON CLINICAL CONDITION.   Vaughan Basta M.D on 07/07/2017   Between 7am to 6pm - Pager - [phone]  After 6pm go to [email or web address] - password [email or web address] [place] [facility]  Office  [phone]  CC: Primary care physician; Center, [facility]  Note: This dictation was prepared with Dragon dictation along with smaller phrase technology. Any transcriptional errors that result from this process are unintentional.

## 2017-07-07 NOTE — Progress Notes (Signed)
Clinical Education officer, museum (CSW) met with patient and his son Raquel Sarna and ex-wife Helene Kelp were at bedside. CSW and family explained the benefits of SNF. CSW explained that patient will have to stay at the SNF for 30 days and sign over his SSI check under medicaid. Patient is agreeable to going to SNF. CSW presented bed offers. Patient chose [facility]. April admissions coordinator at [facility] is aware of accepted bed offer. CSW will continue to follow and assist as needed.   [employer], LCSW [phone]

## 2017-07-07 NOTE — Clinical Social Work Placement (Signed)
   CLINICAL SOCIAL WORK PLACEMENT  NOTE  Date:  07/07/2017  Patient Details  Name: Gregory Marquez MRN: [id number] Date of Birth: 11-Dec-1955  Clinical Social Work is seeking post-discharge placement for this patient at the [place] level of care (*CSW will initial, date and re-position this form in  chart as items are completed):  Yes   Patient/family provided with [facility] Work Department's list of facilities offering this level of care within the geographic area requested by the patient (or if unable, by the patient's family).  Yes   Patient/family informed of their freedom to choose among providers that offer the needed level of care, that participate in Medicare, Medicaid or managed care program needed by the patient, have an available bed and are willing to accept the patient.  Yes   Patient/family informed of [employer]'s ownership interest in [facility] and [facility], as well as of the fact that they are under no obligation to receive care at these facilities.  PASRR submitted to EDS on       PASRR number received on       Existing PASRR number confirmed on 07/07/17     FL2 transmitted to all facilities in geographic area requested by pt/family on 07/07/17     FL2 transmitted to all facilities within larger geographic area on       Patient informed that his/her managed care company has contracts with or will negotiate with certain facilities, including the following:        Yes   Patient/family informed of bed offers received.  Patient chooses bed at [facility] )     Physician recommends and patient chooses bed at      Patient to be transferred to   on  .  Patient to be transferred to facility by       Patient family notified on   of transfer.  Name of family member notified:        PHYSICIAN       Additional Comment:    _______________________________________________ Gregory Marquez, Veronia Beets,  LCSW 07/07/2017, 4:51 PM

## 2017-07-07 NOTE — Progress Notes (Signed)
Chaplain rounding unit visit pt. Pt was lying in the bed at the time of this visit. [place] spoke to pt but pt seemed to be in a confused status. Pt slow to respond [place] questions and was not interactive this AM. CH to follow up with pt as needed.    07/07/17 1100  Clinical Encounter Type  Visited With Patient  Visit Type Initial;Follow-up  Referral From Chaplain  Consult/Referral To Chaplain  Spiritual Encounters  Spiritual Needs Other (Comment)
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
## 2017-07-07 NOTE — Care Management Note (Signed)
Case Management Note  Patient Details  Name: Gregory Marquez MRN: [id number] Date of Birth: 25-May-1956  Subjective/Objective:                  RNCM received call from patient's son Raquel Sarna [phone] about concerns for patient returning home alone.  Patient left a pot of pintos on stove and it was full of maggots. Milk was rotten in cabinet. Doritos in freezer. Patient has a nurse that goes to his house twice a week- nurse with home care but unknown agency. He recently discharged from [facility].   Nurse said he was trying to make a sandwich and putting bolona lid on mayo jar- he worked on that for an hour.  He typically walks with walker post [facility] hospitalization but he can't get out of the bed to portable toilet per [place].  He forgets to take medicine and his ammonia level rises. Balance is off per Shawn. PCP [facility]. Sister Caryl Pina usually take patient. No one has health care power of attorney.  He is not certain patient would agree to long-term placement.    Action/Plan: Advised son Raquel Sarna to to google long-term care facilities and visit with them.  Informed that Medicaid would need to be for long-term care. Informed Shawn to contact DSS case manager for assistance too. PT/OT/SLP evaluation requested. CSW updated Raquel Sarna will have Valma Cava [phone] call me with home health agency name.   Expected Discharge Date:  07/07/17               Expected Discharge Plan:     In-House Referral:  Clinical Social Work  Discharge planning Services  CM Consult  Post Acute Care Choice:    Choice offered to:  Adult Children  DME Arranged:    DME Agency:     HH Arranged:    HH Agency:     Status of Service:  In process, will continue to follow  If discussed at Long Length of Stay Meetings, dates discussed:    Additional Comments:  Marshell Garfinkel, RN 07/07/2017, 8:34 AM

## 2017-07-08 ENCOUNTER — Inpatient Hospital Stay: Payer: Medicaid Other

## 2017-07-08 LAB — COMPREHENSIVE METABOLIC PANEL
ALT: 38 U/L (ref 17–63)
AST: 51 U/L — AB (ref 15–41)
Albumin: 2.5 g/dL — ABNORMAL LOW (ref 3.5–5.0)
Alkaline Phosphatase: 74 U/L (ref 38–126)
Anion gap: 5 (ref 5–15)
BUN: 10 mg/dL (ref 6–20)
CHLORIDE: 105 mmol/L (ref 101–111)
CO2: 23 mmol/L (ref 22–32)
CREATININE: 0.65 mg/dL (ref 0.61–1.24)
Calcium: 8.4 mg/dL — ABNORMAL LOW (ref 8.9–10.3)
GFR calc Af Amer: >60 mL/min (ref >60)
GLUCOSE: 117 mg/dL — AB (ref 65–99)
Potassium: 3.9 mmol/L (ref 3.5–5.1)
Sodium: 133 mmol/L — ABNORMAL LOW (ref 135–145)
Total Bilirubin: 6.3 mg/dL — ABNORMAL HIGH (ref 0.3–1.2)
Total Protein: 6.5 g/dL (ref 6.5–8.1)

## 2017-07-08 LAB — BILIRUBIN, DIRECT: Bilirubin, Direct: 2 mg/dL — ABNORMAL HIGH (ref 0.1–0.5)

## 2017-07-08 LAB — GLUCOSE, CAPILLARY
GLUCOSE-CAPILLARY: 90 mg/dL (ref 65–99)
Glucose-Capillary: 86 mg/dL (ref 65–99)
Glucose-Capillary: 122 mg/dL — ABNORMAL HIGH (ref 65–99)

## 2017-07-08 LAB — AMMONIA: AMMONIA: 63 umol/L — AB (ref 9–35)

## 2017-07-08 MED ORDER — FOLIC ACID 1 MG PO TABS: 1.0000 mg | ORAL_TABLET | Freq: Every day | ORAL | 0 refills | Status: DC

## 2017-07-08 MED ORDER — HYDROCODONE-ACETAMINOPHEN 5-325 MG PO TABS: 1.0000 | ORAL_TABLET | ORAL | 0 refills | Status: DC | PRN

## 2017-07-08 MED ORDER — ADULT MULTIVITAMIN W/MINERALS CH: 1.0000 | ORAL_TABLET | Freq: Every day | ORAL | 0 refills | Status: DC

## 2017-07-08 MED ORDER — THIAMINE HCL 100 MG PO TABS: 100.0000 mg | ORAL_TABLET | Freq: Every day | ORAL | 0 refills | Status: DC

## 2017-07-08 NOTE — Progress Notes (Signed)
Pt. Has been trying to get out of bed on multiple occassions. Bed alarm sounding. Pt. Was given ativan to help him rest when he became restless. Pt. Resting quietly at this time.

## 2017-07-08 NOTE — Progress Notes (Signed)
EMS called for transport. One Rx not signed, paged to alert.

## 2017-07-08 NOTE — Discharge Instructions (Signed)
Palliative care nurse to follow at home. Follow with [facility] GI clinic in 1-2 weeks.
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
## 2017-07-08 NOTE — Care Management (Signed)
RNCM received text message from daughter in law Almyra Free stating that hospital bed should be delivered between 3-7PM today and she will notify [facility] when it has been.  RNCM will notify staff so that EMS can be called.

## 2017-07-08 NOTE — Progress Notes (Signed)
Per RN case manager patient's family has decided to take him home today. April admissions coordinator at [facility] was notified. Clinical Social Worker (CSW) made an Adult Scientist, forensic (APS) report on yesterday 07/07/17 to [facility] due to patient's memory impairment. Please reconsult if future social work needs arise. CSW signing off.   [employer], LCSW [phone]

## 2017-07-08 NOTE — Care Management (Addendum)
RNCM received call from patient's daughter in-law  Glendale Youngblood [phone] stating that she and her husband Raquel Sarna have talked about bringing patient home. They desire to bring him home under rotating 24/7 care that they have arranged followed by current home health agency [facility].  I have notified Glennis Brink with [facility] of this plan and the need for for RN, PT, OT, SLP, SW and palliative. I have notified Santiago Glad with [facility] of this need. I talked at length with Almyra Free about this decision and asked her to a least let him stay for 30-days at SNF while they make arrangements to bring him home.  I told her she would need to go through patient's DSS Case worker to apply for [facility] services and this could take up to a year. She still insists on bringing him home.  She states that he will need a hospital bed and EMS to home.  We discussed hospice or palliative care also and she would like the home health agency to guide them with that decision.  CSW updated. [facility]. MD updated. Almyra Free is ready to receive patient today if hospital bed is delivered first. Address: [address] [place] [address].

## 2017-07-08 NOTE — Care Management (Signed)
Gregory Marquez with [facility] notified of patient discharge today son's address. EMS packet complete with DNR. RN can call EMS when hospital bed has been delivered to patient's son's address.

## 2017-07-08 NOTE — Care Management (Signed)
Patient spends 95% of the time in bed. Problems with aspiration due to dementia causes frequent aspiration risks. Torso required to be elevated at least 30 degrees or more to prevent aspiration. Bed wedges do not provide adequate elevation to resolve issues with aspiration.  Choking requires immediate changes in body position which cannot be achieve with a normal bed.

## 2017-07-08 NOTE — Care Management (Signed)
RNCM called [facility] courier services [phone] ext [phone] to get ETA on bed delivery (more specific). No answer.

## 2017-07-08 NOTE — Progress Notes (Signed)
New referral for out Patient Palliative to follow at home received from [facility]. Patient information faxed to referral. Flo Shanks RN, BSN, [facility] [place], hospital Liaison [phone]

## 2017-07-09 NOTE — Care Management (Signed)
Written RX for bedside commode received and [facility]ed to [facility] [phone]. Almyra Free notified

## 2017-07-09 NOTE — Care Management (Signed)
Post discharge note entry: [facility] sent text this AM requesting bedside commode. RX requested. Almyra Free will pick up [facility] at [facility].

## 2017-07-13 ENCOUNTER — Emergency Department: Payer: Medicaid Other

## 2017-07-13 ENCOUNTER — Other Ambulatory Visit: Payer: Self-pay

## 2017-07-13 ENCOUNTER — Inpatient Hospital Stay
Admission: EM | Admit: 2017-07-13 | Discharge: 2017-07-14 | DRG: 434 | Disposition: A | Discharge status: Skilled Nursing Facility | Payer: Medicaid Other | Attending: Internal Medicine | Admitting: Internal Medicine

## 2017-07-13 ENCOUNTER — Encounter: Payer: Self-pay | Admitting: Emergency Medicine

## 2017-07-13 DIAGNOSIS — J449 Chronic obstructive pulmonary disease, unspecified: Secondary | ICD-10-CM | POA: Diagnosis present

## 2017-07-13 DIAGNOSIS — Z7951 Long term (current) use of inhaled steroids: Secondary | ICD-10-CM

## 2017-07-13 DIAGNOSIS — Z66 Do not resuscitate: Secondary | ICD-10-CM | POA: Diagnosis present

## 2017-07-13 DIAGNOSIS — E119 Type 2 diabetes mellitus without complications: Secondary | ICD-10-CM | POA: Diagnosis present

## 2017-07-13 DIAGNOSIS — Z85038 Personal history of other malignant neoplasm of large intestine: Secondary | ICD-10-CM

## 2017-07-13 DIAGNOSIS — K7200 Acute and subacute hepatic failure without coma: Secondary | ICD-10-CM

## 2017-07-13 DIAGNOSIS — K7290 Hepatic failure, unspecified without coma: Secondary | ICD-10-CM | POA: Diagnosis present

## 2017-07-13 DIAGNOSIS — I4581 Long QT syndrome: Secondary | ICD-10-CM | POA: Diagnosis present

## 2017-07-13 DIAGNOSIS — K7030 Alcoholic cirrhosis of liver without ascites: Secondary | ICD-10-CM | POA: Diagnosis present

## 2017-07-13 DIAGNOSIS — K7040 Alcoholic hepatic failure without coma: Principal | ICD-10-CM | POA: Diagnosis present

## 2017-07-13 DIAGNOSIS — I509 Heart failure, unspecified: Secondary | ICD-10-CM | POA: Diagnosis present

## 2017-07-13 DIAGNOSIS — Z85828 Personal history of other malignant neoplasm of skin: Secondary | ICD-10-CM

## 2017-07-13 DIAGNOSIS — Z7982 Long term (current) use of aspirin: Secondary | ICD-10-CM

## 2017-07-13 DIAGNOSIS — Z888 Allergy status to other drugs, medicaments and biological substances status: Secondary | ICD-10-CM

## 2017-07-13 DIAGNOSIS — F0390 Unspecified dementia without behavioral disturbance: Secondary | ICD-10-CM | POA: Diagnosis present

## 2017-07-13 DIAGNOSIS — K7682 Hepatic encephalopathy: Secondary | ICD-10-CM | POA: Diagnosis present

## 2017-07-13 DIAGNOSIS — K219 Gastro-esophageal reflux disease without esophagitis: Secondary | ICD-10-CM | POA: Diagnosis present

## 2017-07-13 DIAGNOSIS — F1020 Alcohol dependence, uncomplicated: Secondary | ICD-10-CM | POA: Diagnosis present

## 2017-07-13 DIAGNOSIS — Z79899 Other long term (current) drug therapy: Secondary | ICD-10-CM

## 2017-07-13 LAB — COMPREHENSIVE METABOLIC PANEL
ALBUMIN: 2.6 g/dL — AB (ref 3.5–5.0)
ALT: 40 U/L (ref 17–63)
ANION GAP: 8 (ref 5–15)
AST: 56 U/L — ABNORMAL HIGH (ref 15–41)
Alkaline Phosphatase: 89 U/L (ref 38–126)
BUN: 17 mg/dL (ref 6–20)
CO2: 23 mmol/L (ref 22–32)
Calcium: 8.9 mg/dL (ref 8.9–10.3)
Chloride: 101 mmol/L (ref 101–111)
Creatinine, Ser: 0.75 mg/dL (ref 0.61–1.24)
GFR calc Af Amer: >60 mL/min (ref >60)
GFR calc non Af Amer: >60 mL/min (ref >60)
GLUCOSE: 109 mg/dL — AB (ref 65–99)
POTASSIUM: 4.1 mmol/L (ref 3.5–5.1)
SODIUM: 132 mmol/L — AB (ref 135–145)
Total Bilirubin: 5.4 mg/dL — ABNORMAL HIGH (ref 0.3–1.2)
Total Protein: 7 g/dL (ref 6.5–8.1)

## 2017-07-13 LAB — CBC WITH DIFFERENTIAL/PLATELET
BASOS PCT: 1 %
Basophils Absolute: 0.1 10*3/uL (ref 0–0.1)
EOS ABS: 0.2 10*3/uL (ref 0–0.7)
Eosinophils Relative: 4 %
HCT: 30.2 % — ABNORMAL LOW (ref 40.0–52.0)
HEMOGLOBIN: 10.2 g/dL — AB (ref 13.0–18.0)
Lymphocytes Relative: 11 %
Lymphs Abs: 0.7 10*3/uL — ABNORMAL LOW (ref 1.0–3.6)
MCH: 35.6 pg — ABNORMAL HIGH (ref 26.0–34.0)
MCHC: 33.9 g/dL (ref 32.0–36.0)
MCV: 105.2 fL — ABNORMAL HIGH (ref 80.0–100.0)
MONO ABS: 0.9 10*3/uL (ref 0.2–1.0)
MONOS PCT: 16 %
NEUTROS PCT: 68 %
Neutro Abs: 3.9 10*3/uL (ref 1.4–6.5)
Platelets: 137 10*3/uL — ABNORMAL LOW (ref 150–440)
RBC: 2.87 MIL/uL — ABNORMAL LOW (ref 4.40–5.90)
RDW: 21.5 % — AB (ref 11.5–14.5)
WBC: 5.7 10*3/uL (ref 3.8–10.6)

## 2017-07-13 LAB — AMMONIA: Ammonia: 146 umol/L — ABNORMAL HIGH (ref 9–35)

## 2017-07-13 LAB — APTT: APTT: 35 s (ref 24–36)

## 2017-07-13 LAB — PROTIME-INR
INR: 1.79
Prothrombin Time: 20.6 seconds — ABNORMAL HIGH (ref 11.4–15.2)

## 2017-07-13 MED ORDER — ADULT MULTIVITAMIN W/MINERALS CH
1.0000 | ORAL_TABLET | Freq: Every day | ORAL | Status: DC
  Administered 2017-07-13 – 2017-07-14 (×2): 1 via ORAL
  Filled 2017-07-13 (×2): qty 1

## 2017-07-13 MED ORDER — FOLIC ACID 1 MG PO TABS
1.0000 mg | ORAL_TABLET | Freq: Every day | ORAL | Status: DC
  Administered 2017-07-13 – 2017-07-14 (×2): 1 mg via ORAL
  Filled 2017-07-13 (×2): qty 1

## 2017-07-13 MED ORDER — HEPARIN SODIUM (PORCINE) 5000 UNIT/ML IJ SOLN
5000.0000 [IU] | Freq: Three times a day (TID) | INTRAMUSCULAR | Status: DC
  Administered 2017-07-13 – 2017-07-14 (×3): 5000 [IU] via SUBCUTANEOUS
  Filled 2017-07-13 (×3): qty 1

## 2017-07-13 MED ORDER — POLYETHYLENE GLYCOL 3350 17 G PO PACK: 17.0000 g | PACK | Freq: Every day | ORAL | Status: DC | PRN

## 2017-07-13 MED ORDER — LACTULOSE 10 GM/15ML PO SOLN
30.0000 g | Freq: Once | ORAL | Status: AC
  Administered 2017-07-13: 30 g via ORAL
  Filled 2017-07-13: qty 60

## 2017-07-13 MED ORDER — SODIUM CHLORIDE 1 G PO TABS
1.0000 g | ORAL_TABLET | Freq: Three times a day (TID) | ORAL | Status: DC
  Administered 2017-07-14 (×2): 1 g via ORAL
  Filled 2017-07-13 (×3): qty 1

## 2017-07-13 MED ORDER — ALBUTEROL SULFATE (2.5 MG/3ML) 0.083% IN NEBU: 2.5000 mg | INHALATION_SOLUTION | RESPIRATORY_TRACT | Status: DC | PRN

## 2017-07-13 MED ORDER — SODIUM CHLORIDE 0.9% FLUSH
3.0000 mL | Freq: Two times a day (BID) | INTRAVENOUS | Status: DC
  Administered 2017-07-13 – 2017-07-14 (×2): 3 mL via INTRAVENOUS

## 2017-07-13 MED ORDER — LACTULOSE 10 GM/15ML PO SOLN
40.0000 g | Freq: Three times a day (TID) | ORAL | Status: DC
  Administered 2017-07-13 – 2017-07-14 (×2): 40 g via ORAL
  Filled 2017-07-13 (×2): qty 60

## 2017-07-13 MED ORDER — ACETAMINOPHEN 650 MG RE SUPP
650.0000 mg | Freq: Four times a day (QID) | RECTAL | Status: DC | PRN
Start: 2017-07-13 — End: 2017-07-14

## 2017-07-13 MED ORDER — ACETAMINOPHEN 325 MG PO TABS: 650.0000 mg | ORAL_TABLET | Freq: Four times a day (QID) | ORAL | Status: DC | PRN

## 2017-07-13 MED ORDER — ONDANSETRON HCL 4 MG/2ML IJ SOLN: 4.0000 mg | Freq: Four times a day (QID) | INTRAMUSCULAR | Status: DC | PRN

## 2017-07-13 MED ORDER — RIFAXIMIN 550 MG PO TABS
550.0000 mg | ORAL_TABLET | Freq: Two times a day (BID) | ORAL | Status: DC
  Administered 2017-07-13 – 2017-07-14 (×2): 550 mg via ORAL
  Filled 2017-07-13 (×3): qty 1

## 2017-07-13 MED ORDER — FUROSEMIDE 40 MG PO TABS
60.0000 mg | ORAL_TABLET | Freq: Every day | ORAL | Status: DC
  Administered 2017-07-13 – 2017-07-14 (×2): 60 mg via ORAL
  Filled 2017-07-13 (×2): qty 1

## 2017-07-13 MED ORDER — MOMETASONE FURO-FORMOTEROL FUM 100-5 MCG/ACT IN AERO
2.0000 | INHALATION_SPRAY | Freq: Two times a day (BID) | RESPIRATORY_TRACT | Status: DC
  Administered 2017-07-14: 2 via RESPIRATORY_TRACT
  Filled 2017-07-13: qty 8.8

## 2017-07-13 MED ORDER — SODIUM CHLORIDE 0.9% FLUSH
3.0000 mL | Freq: Two times a day (BID) | INTRAVENOUS | Status: DC
  Administered 2017-07-13: 3 mL via INTRAVENOUS

## 2017-07-13 MED ORDER — ONDANSETRON HCL 4 MG PO TABS: 4.0000 mg | ORAL_TABLET | Freq: Four times a day (QID) | ORAL | Status: DC | PRN

## 2017-07-13 MED ORDER — SODIUM CHLORIDE 0.9% FLUSH: 3.0000 mL | INTRAVENOUS | Status: DC | PRN

## 2017-07-13 MED ORDER — LACTULOSE ENEMA
300.0000 mL | Freq: Once | ORAL | Status: AC
  Administered 2017-07-13: 300 mL via RECTAL
  Filled 2017-07-13: qty 300

## 2017-07-13 MED ORDER — PANTOPRAZOLE SODIUM 40 MG PO TBEC
40.0000 mg | DELAYED_RELEASE_TABLET | Freq: Every day | ORAL | Status: DC
  Administered 2017-07-13 – 2017-07-14 (×2): 40 mg via ORAL
  Filled 2017-07-13 (×2): qty 1

## 2017-07-13 MED ORDER — SPIRONOLACTONE 25 MG PO TABS
100.0000 mg | ORAL_TABLET | Freq: Every day | ORAL | Status: DC
  Administered 2017-07-13 – 2017-07-14 (×2): 100 mg via ORAL
  Filled 2017-07-13 (×2): qty 4

## 2017-07-13 MED ORDER — VITAMIN B-1 100 MG PO TABS
100.0000 mg | ORAL_TABLET | Freq: Every day | ORAL | Status: DC
  Administered 2017-07-13 – 2017-07-14 (×2): 100 mg via ORAL
  Filled 2017-07-13 (×2): qty 1

## 2017-07-13 MED ORDER — ASPIRIN EC 81 MG PO TBEC
81.0000 mg | DELAYED_RELEASE_TABLET | Freq: Every day | ORAL | Status: DC
  Administered 2017-07-13 – 2017-07-14 (×2): 81 mg via ORAL
  Filled 2017-07-13 (×2): qty 1

## 2017-07-13 MED ORDER — SODIUM CHLORIDE 0.9 % IV SOLN: 250.0000 mL | INTRAVENOUS | Status: DC | PRN
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
## 2017-07-13 NOTE — ED Triage Notes (Signed)
Pt presents to ED via [facility] from home c/o AMS. EMS called out by home health worker who states pt is lethargic, confused, and weaker than normal. Pt has hx elevated ammonia levels. Presents with yellowed skin and sclera, abdomen slightly distended.

## 2017-07-14 DIAGNOSIS — E119 Type 2 diabetes mellitus without complications: Secondary | ICD-10-CM | POA: Diagnosis present

## 2017-07-14 DIAGNOSIS — Z7982 Long term (current) use of aspirin: Secondary | ICD-10-CM | POA: Diagnosis not present

## 2017-07-14 DIAGNOSIS — K219 Gastro-esophageal reflux disease without esophagitis: Secondary | ICD-10-CM | POA: Diagnosis present

## 2017-07-14 DIAGNOSIS — I4581 Long QT syndrome: Secondary | ICD-10-CM | POA: Diagnosis present

## 2017-07-14 DIAGNOSIS — Z85828 Personal history of other malignant neoplasm of skin: Secondary | ICD-10-CM | POA: Diagnosis not present

## 2017-07-14 DIAGNOSIS — Z888 Allergy status to other drugs, medicaments and biological substances status: Secondary | ICD-10-CM | POA: Diagnosis not present

## 2017-07-14 DIAGNOSIS — Z7951 Long term (current) use of inhaled steroids: Secondary | ICD-10-CM | POA: Diagnosis not present

## 2017-07-14 DIAGNOSIS — J449 Chronic obstructive pulmonary disease, unspecified: Secondary | ICD-10-CM | POA: Diagnosis present

## 2017-07-14 DIAGNOSIS — Z85038 Personal history of other malignant neoplasm of large intestine: Secondary | ICD-10-CM | POA: Diagnosis not present

## 2017-07-14 DIAGNOSIS — Z66 Do not resuscitate: Secondary | ICD-10-CM | POA: Diagnosis present

## 2017-07-14 DIAGNOSIS — K7200 Acute and subacute hepatic failure without coma: Secondary | ICD-10-CM | POA: Diagnosis not present

## 2017-07-14 DIAGNOSIS — K7040 Alcoholic hepatic failure without coma: Secondary | ICD-10-CM | POA: Diagnosis present

## 2017-07-14 DIAGNOSIS — F1020 Alcohol dependence, uncomplicated: Secondary | ICD-10-CM | POA: Diagnosis present

## 2017-07-14 DIAGNOSIS — F0390 Unspecified dementia without behavioral disturbance: Secondary | ICD-10-CM | POA: Diagnosis present

## 2017-07-14 DIAGNOSIS — Z79899 Other long term (current) drug therapy: Secondary | ICD-10-CM | POA: Diagnosis not present

## 2017-07-14 DIAGNOSIS — K7030 Alcoholic cirrhosis of liver without ascites: Secondary | ICD-10-CM | POA: Diagnosis present

## 2017-07-14 DIAGNOSIS — I509 Heart failure, unspecified: Secondary | ICD-10-CM | POA: Diagnosis present

## 2017-07-14 LAB — BASIC METABOLIC PANEL
Anion gap: 9 (ref 5–15)
BUN: 15 mg/dL (ref 6–20)
CALCIUM: 8.5 mg/dL — AB (ref 8.9–10.3)
CO2: 22 mmol/L (ref 22–32)
CREATININE: 0.74 mg/dL (ref 0.61–1.24)
Chloride: 106 mmol/L (ref 101–111)
GFR calc Af Amer: >60 mL/min (ref >60)
GLUCOSE: 130 mg/dL — AB (ref 65–99)
Potassium: 3.5 mmol/L (ref 3.5–5.1)
Sodium: 137 mmol/L (ref 135–145)

## 2017-07-14 LAB — AMMONIA: AMMONIA: 33 umol/L (ref 9–35)

## 2017-07-14 NOTE — NC FL2 (Signed)
[place] LEVEL OF CARE SCREENING TOOL     IDENTIFICATION  Patient Name: Gregory Marquez Birthdate: 28-Oct-1955 Sex: male Admission Date (Current Location): 07/13/2017  [place] and [place] Number:  Engineering geologist and Address:  [facility], [address], [place], [place] [address]      Provider Number: [id number]  Attending Physician Name and Address:  Fritzi Mandes, MD  Relative Name and Phone Number:       Current Level of Care: Hospital Recommended Level of Care: [place] Prior Approval Number:    Date Approved/Denied:   PASRR Number: [id number]  Discharge Plan: SNF    Current Diagnoses: Patient Active Problem List   Diagnosis Date Noted  . Hepatic encephalopathy ([place]) 07/05/2017    Orientation RESPIRATION BLADDER Height & Weight     Self, Place  Normal Incontinent Weight: 171 lb 11.2 oz (77.9 kg) Height:  5\' 7"  (170.2 cm)  BEHAVIORAL SYMPTOMS/MOOD NEUROLOGICAL BOWEL NUTRITION STATUS  (none) (none) Incontinent Diet(2 gm na)  AMBULATORY STATUS COMMUNICATION OF NEEDS Skin   Extensive Assist Verbally Normal                       Personal Care Assistance Level of Assistance  Bathing, Dressing Bathing Assistance: Maximum assistance Feeding assistance: Maximum assistance Dressing Assistance: Maximum assistance     Functional Limitations Info  (none)          SPECIAL CARE FACTORS FREQUENCY                       Contractures Contractures Info: Not present    Additional Factors Info  Code Status, Allergies Code Status Info: dnr Allergies Info: effexor           Current Medications (07/14/2017):  This is the current hospital active medication list Current Facility-Administered Medications  Medication Dose Route Frequency Provider Last Rate Last Dose  . 0.9 %  sodium chloride infusion  250 mL Intravenous PRN Salary, Montell D, MD      . acetaminophen (TYLENOL) tablet 650 mg   650 mg Oral Q6H PRN Salary, Montell D, MD       Or  . acetaminophen (TYLENOL) suppository 650 mg  650 mg Rectal Q6H PRN Salary, Montell D, MD      . albuterol (PROVENTIL) (2.5 MG/3ML) 0.083% nebulizer solution 2.5 mg  2.5 mg Inhalation Q4H PRN Salary, Montell D, MD      . aspirin EC tablet 81 mg  81 mg Oral Daily Salary, Montell D, MD   81 mg at 07/14/17 0956  . folic acid (FOLVITE) tablet 1 mg  1 mg Oral Daily Salary, Montell D, MD   1 mg at 07/14/17 0956  . furosemide (LASIX) tablet 60 mg  60 mg Oral Daily Salary, Montell D, MD   60 mg at 07/14/17 0956  . heparin injection 5,000 Units  5,000 Units Subcutaneous Q8H Salary, Montell D, MD   5,000 Units at 07/14/17 1339  . lactulose (CHRONULAC) 10 GM/15ML solution 40 g  40 g Oral TID Loney Hering D, MD   40 g at 07/14/17 0955  . mometasone-formoterol (DULERA) 100-5 MCG/ACT inhaler 2 puff  2 puff Inhalation BID Loney Hering D, MD   2 puff at 07/14/17 0819  . multivitamin with minerals tablet 1 tablet  1 tablet Oral Daily Salary, Avel Peace, MD   1 tablet at 07/14/17 0956  . ondansetron (ZOFRAN) tablet 4 mg  4 mg Oral Q6H PRN Salary, Montell D, MD       Or  . ondansetron (ZOFRAN) injection 4 mg  4 mg Intravenous Q6H PRN Salary, Montell D, MD      . pantoprazole (PROTONIX) EC tablet 40 mg  40 mg Oral Daily Salary, Montell D, MD   40 mg at 07/14/17 0956  . polyethylene glycol (MIRALAX / GLYCOLAX) packet 17 g  17 g Oral Daily PRN Salary, Montell D, MD      . rifaximin (XIFAXAN) tablet 550 mg  550 mg Oral BID Loney Hering D, MD   550 mg at 07/14/17 0956  . sodium chloride flush (NS) 0.9 % injection 3 mL  3 mL Intravenous Q12H Salary, Montell D, MD   3 mL at 07/14/17 1014  . sodium chloride flush (NS) 0.9 % injection 3 mL  3 mL Intravenous Q12H Salary, Montell D, MD   3 mL at 07/13/17 2218  . sodium chloride flush (NS) 0.9 % injection 3 mL  3 mL Intravenous PRN Salary, Montell D, MD      . sodium chloride tablet 1 g  1 g Oral TID WC Salary,  Montell D, MD   1 g at 07/14/17 1132  . spironolactone (ALDACTONE) tablet 100 mg  100 mg Oral Daily Salary, Montell D, MD   100 mg at 07/14/17 0955  . thiamine (VITAMIN B-1) tablet 100 mg  100 mg Oral Daily Salary, Montell D, MD   100 mg at 07/14/17 2409     Discharge Medications: Please see discharge summary for a list of discharge medications.  Relevant Imaging Results:  Relevant Lab Results:   Additional Information ss: [id number]  Shela Leff, LCSW

## 2017-07-14 NOTE — Clinical Social Work Note (Addendum)
CSW has notified [place] with [facility] APS of patient's discharge to [place] today. CSW has also notified Santiago Glad with [place] is aware so Palliative will follow over at [name]. Shela Leff MSW,LCSW [phone]

## 2017-07-14 NOTE — Clinical Social Work Note (Signed)
Clinical Social Work Assessment  Patient Details  Name: Gregory Marquez MRN: [id number] Date of Birth: [birth year]  Date of referral:  07/14/17               Reason for consult:  Discharge Planning                Permission sought to share information with:  Family Supports, Customer service manager Permission granted to share information::  Yes, Verbal Permission Granted  Name::        Agency::     Relationship::     Contact Information:     Housing/Transportation Living arrangements for the past 2 months:  Single Family Home Source of Information:  Patient, Adult Children Patient Interpreter Needed:  None Criminal Activity/Legal Involvement Pertinent to Current Situation/Hospitalization:  No - Comment as needed Significant Relationships:  Adult Children Lives with:  Adult Children, Self Do you feel safe going back to the place where you live?    Need for family participation in patient care:  Yes (Comment)  Care giving concerns:  Patient used to reside alone but on last admission went home to his son and daughter in [place] home.   Social Worker assessment / plan:  CSW spoke with patient's son and daughter in law today on several occassions. CSW was able to secure a medicaid bed in [facility] which is a rare thing. [facility] is willing to take patient. Son and daughter in law are aware. Almyra Free, the daughter in law, does not care for [name] but realizes this in one of two offers and that this offer is more local than the other. MD aware and to discharge patient today.  Employment status:  Retired Forensic scientist:  Medicaid In [place] PT Recommendations:  [place] / Referral to community resources:     Patient/Family's Response to care:  Patient's son and daughter are disappointed at the lack of choices that patient has for long term care.  Patient/Family's Understanding of and Emotional Response to Diagnosis,  Current Treatment, and Prognosis:  Patient's son and daughter in law are also aware that they are unable to care for patient at home.  Emotional Assessment Appearance:  Appears stated age Attitude/Demeanor/Rapport:    Affect (typically observed):  Calm Orientation:  Oriented to Self Alcohol / Substance use:  Alcohol Use(history of ETOH) Psych involvement (Current and /or in the community):  No (Comment)  Discharge Needs  Concerns to be addressed:  Care Coordination Readmission within the last 30 days:  No Current discharge risk:  None Barriers to Discharge:  No Barriers Identified   Shela Leff, LCSW 07/14/2017, 2:17 PM

## 2017-07-14 NOTE — Care Management (Signed)
Patient to discharge to [facility] today.  CSW facilitating.  Notified Sharyn Lull with [facility] of discharge disposition.  RNCM signing off.

## 2017-07-14 NOTE — Progress Notes (Signed)
[place] at [place] NAME: Gregory Marquez    MR#:  [id number]  DATE OF BIRTH:  02/08/1956  SUBJECTIVE:   Came in with increasing confusion. Answered some questions appropriately. Some confusion+ REVIEW OF SYSTEMS:   Review of Systems  Unable to perform ROS: Mental status change   Tolerating Diet:yes Tolerating PT: pending  DRUG ALLERGIES:   Allergies  Allergen Reactions  . Effexor [Venlafaxine]     VITALS:  Blood pressure (!) 164/81, pulse 89, temperature 98.4 F (36.9 C), temperature source Oral, resp. rate 18, height 5\' 7"  (1.702 m), weight 77.9 kg (171 lb 11.2 oz), SpO2 100 %.  PHYSICAL EXAMINATION:   Physical Exam  GENERAL:  61 y.o.-year-old patient lying in the bed with no acute distress. Chronically ill EYES: Pupils equal, round, reactive to light and accommodation. + scleral icterus. Extraocular muscles intact.  HEENT: Head atraumatic, normocephalic. Oropharynx and nasopharynx clear.  NECK:  Supple, no jugular venous distention. No thyroid enlargement, no tenderness.  LUNGS: Normal breath sounds bilaterally, no wheezing, rales, rhonchi. No use of accessory muscles of respiration.  CARDIOVASCULAR: S1, S2 normal. No murmurs, rubs, or gallops.  ABDOMEN: Soft, nontender, nondistended. Bowel sounds present. No organomegaly or mass.  EXTREMITIES: No cyanosis, clubbing or edema b/l.    NEUROLOGIC: moves all extremities well PSYCHIATRIC:  patient is sleepy but answers some questions SKIN: No obvious rash, lesion, or ulcer.   LABORATORY PANEL:  CBC Recent Labs  Lab 07/13/17 1521  WBC 5.7  HGB 10.2*  HCT 30.2*  PLT 137*    Chemistries  Recent Labs  Lab 07/13/17 1521 07/14/17 0512  NA 132* 137  K 4.1 3.5  CL 101 106  CO2 23 22  GLUCOSE 109* 130*  BUN 17 15  CREATININE 0.75 0.74  CALCIUM 8.9 8.5*  AST 56*  --   ALT 40  --   ALKPHOS 89  --   BILITOT 5.4*  --    Cardiac Enzymes No results for  input(s): TROPONINI in the last 168 hours. RADIOLOGY:  Dg Chest Portable 1 View  Result Date: 07/13/2017 CLINICAL DATA:  Altered mental status.  Evaluate for pneumonia. EXAM: PORTABLE CHEST 1 VIEW COMPARISON:  Chest x-ray dated April 25, 2017. FINDINGS: The cardiomediastinal silhouette is normal in size. Normal pulmonary vascularity. No focal consolidation, pleural effusion, or pneumothorax. No acute osseous abnormality. IMPRESSION: No active disease. Electronically Signed   By: Titus Dubin M.D.   On: 07/13/2017 15:46   ASSESSMENT AND PLAN:  Gregory Marquez  is a 61 y.o. 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 Discharged from a hospital approximately 5 days ago Followed by [facility] in with ammonia of 146--33 Cont lactulose and rifaximin  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 Management plans discussed with the patient, family and they are in agreement.  CODE STATUS: DNR  DVT Prophylaxis: SCD  TOTAL TIME TAKING CARE OF THIS PATIENT: *30* minutes.  >50% time spent on counselling and coordination of  care  POSSIBLE D/C IN *1-2* DAYS, DEPENDING ON CLINICAL CONDITION.  Note: This dictation was prepared with Dragon dictation along with smaller phrase technology. Any transcriptional errors that result from this process are unintentional.  Fritzi Mandes M.D on 07/14/2017 at 11:34 AM  Between 7am to 6pm - Pager - [phone]  After 6pm go to [email or web address] - password [email or web address] [place] [facility]  Office  [phone]  CC: Primary care physician; Center, [facility]

## 2017-07-14 NOTE — Progress Notes (Signed)
Please note patient has a PENDING out patient Palliative referral from his previous admission. CMRN Isaias Cowman aware. Thank you. Flo Shanks RN, BSN, [place] [facility] [place], [facility] [phone] c
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
## 2017-07-14 NOTE — Progress Notes (Signed)
Spoke with patient's daughter-in-law Lyndon Code who works at the [place] center at length explained to her overall patient's condition and that he carries a long-term poor prognosis.  She voiced understanding. Almyra Free is okay with patient going to rehab today. Explained her to call GI [facility] clinic and make a follow-up appointment.

## 2017-07-14 NOTE — Progress Notes (Signed)
Gregory Marquez to be D/C'd Skilled nursing facility per MD order.  Discussed prescriptions and follow up appointments with the patient. Prescriptions given to patient, medication list explained in detail. Pt verbalized understanding.  Allergies as of 07/14/2017      Reactions   Effexor [venlafaxine]       Medication List    STOP taking these medications   HYDROcodone-acetaminophen 5-325 MG tablet Commonly known as:  NORCO/VICODIN     TAKE these medications   albuterol 108 (90 Base) MCG/ACT inhaler Commonly known as:  PROVENTIL HFA;VENTOLIN HFA Inhale 2 puffs every 4 (four) hours as needed into the lungs for wheezing or shortness of breath.   amitriptyline 50 MG tablet Commonly known as:  ELAVIL Take 50 mg at bedtime by mouth.   aspirin EC 81 MG tablet Take 81 mg daily by mouth.   citalopram 20 MG tablet Commonly known as:  CELEXA Take 20 mg daily by mouth.   Fluticasone-Salmeterol 100-50 MCG/DOSE Aepb Commonly known as:  ADVAIR Inhale 1 puff 2 (two) times daily into the lungs.   folic acid 1 MG tablet Commonly known as:  FOLVITE Take 1 tablet (1 mg total) daily by mouth.   furosemide 20 MG tablet Commonly known as:  LASIX Take 60 mg daily by mouth.   hydrocortisone 2.5 % lotion Apply 1 application 2 (two) times daily topically.   ketoconazole 2 % shampoo Commonly known as:  NIZORAL Apply 1 application once a week topically.   ketoconazole 2 % cream Commonly known as:  NIZORAL Apply 1 application 2 (two) times daily topically.   lactulose 20 g packet Commonly known as:  CEPHULAC Take 40 g 3 (three) times daily by mouth.   multivitamin with minerals Tabs tablet Take 1 tablet daily by mouth.   pantoprazole 40 MG tablet Commonly known as:  PROTONIX Take 40 mg daily by mouth.   spironolactone 100 MG tablet Commonly known as:  ALDACTONE Take 100 mg daily by mouth.   thiamine 100 MG tablet Take 1 tablet (100 mg total) daily by mouth.   traZODone 150  MG tablet Commonly known as:  DESYREL Take 150 mg at bedtime by mouth.   XIFAXAN 550 MG Tabs tablet Generic drug:  rifaximin Take 550 mg 2 (two) times daily by mouth.       Vitals:   07/14/17 1141 07/14/17 1528  BP: (!) 159/79 (!) 161/78  Pulse: (!) 101 99  Resp: 18 16  Temp: 97.8 F (36.6 C) 98.2 F (36.8 C)  SpO2: 100% 100%    Skin clean, dry and intact without evidence of skin break down, no evidence of skin tears noted. IV catheter discontinued intact. Site without signs and symptoms of complications. Dressing and pressure applied. Pt denies pain at this time. No complaints noted.  An After Visit Summary was printed and given to the patient. Patient transported via EMS to SNF.  Sharalyn Ink

## 2017-07-14 NOTE — Clinical Social Work Placement (Signed)
   CLINICAL SOCIAL WORK PLACEMENT  NOTE  Date:  07/14/2017  Patient Details  Name: Gregory Marquez MRN: [id number] Date of Birth: 19-Mar-1956  Clinical Social Work is seeking post-discharge placement for this patient at the [place] level of care (*CSW will initial, date and re-position this form in  chart as items are completed):  Yes   Patient/family provided with [place] Work Department's list of facilities offering this level of care within the geographic area requested by the patient (or if unable, by the patient's family).  Yes   Patient/family informed of their freedom to choose among providers that offer the needed level of care, that participate in Medicare, Medicaid or managed care program needed by the patient, have an available bed and are willing to accept the patient.  Yes   Patient/family informed of [facility]'s ownership interest in [facility] and [facility], as well as of the fact that they are under no obligation to receive care at these facilities.  PASRR submitted to EDS on       PASRR number received on       Existing PASRR number confirmed on 07/14/17     FL2 transmitted to all facilities in geographic area requested by pt/family on 07/14/17     FL2 transmitted to all facilities within larger geographic area on       Patient informed that his/her managed care company has contracts with or will negotiate with certain facilities, including the following:        Yes   Patient/family informed of bed offers received.  Patient chooses bed at [facility])     Physician recommends and patient chooses bed at [facility])    Patient to be transferred to [employer]) on 07/14/17.  Patient to be transferred to facility by (EMS)     Patient family notified on 07/14/17 of transfer.  Name of family member notified:  (Son and daughter in law: julie)     PHYSICIAN       Additional Comment:     _______________________________________________ Shela Leff, LCSW 07/14/2017, 2:15 PM

## 2017-07-14 NOTE — Clinical Social Work Note (Signed)
CSW has received bed offer from [facility]. Daughter in law: Almyra Free and son are aware and MD to discharge patient today. Discharge information to be sent and patient to transport via EMS. Shela Leff MSW,LCSW [phone]

## 2017-07-14 NOTE — Plan of Care (Signed)
Patient has been given lactulose to decrease ammonia level.  Gregory Marquez

## 2017-07-14 NOTE — Care Management (Signed)
Patient admitted for hepatic encephalopathy.  Previous discharge patient was discharged to the home of his son, and daughter in law. Followed by [facility].  Blanchard Mane of [facility] notified of admission.  Previous admission patient was set up with a hospital bed, and BSC.  It was noted that referral was made to outpatient palliative care.  Santiago Glad with [facility] notified of admission.  Reported that patient's family now wishes to pursue placement at SNF with Medicaid benefits.  CSW aware.  RNCM following for discharge planning needs.

## 2017-07-20 ENCOUNTER — Ambulatory Visit (INDEPENDENT_AMBULATORY_CARE_PROVIDER_SITE_OTHER): Payer: Medicaid Other | Admitting: Gastroenterology

## 2017-07-20 ENCOUNTER — Other Ambulatory Visit: Payer: Self-pay

## 2017-07-20 ENCOUNTER — Encounter: Payer: Self-pay | Admitting: Gastroenterology

## 2017-07-20 DIAGNOSIS — E119 Type 2 diabetes mellitus without complications: Secondary | ICD-10-CM | POA: Insufficient documentation

## 2017-07-20 DIAGNOSIS — K7030 Alcoholic cirrhosis of liver without ascites: Secondary | ICD-10-CM

## 2017-07-20 DIAGNOSIS — I2510 Atherosclerotic heart disease of native coronary artery without angina pectoris: Secondary | ICD-10-CM | POA: Insufficient documentation

## 2017-07-20 DIAGNOSIS — E782 Mixed hyperlipidemia: Secondary | ICD-10-CM | POA: Insufficient documentation

## 2017-07-20 DIAGNOSIS — I10 Essential (primary) hypertension: Secondary | ICD-10-CM | POA: Insufficient documentation

## 2017-07-20 MED ORDER — FUROSEMIDE 20 MG PO TABS: 60.0000 mg | ORAL_TABLET | Freq: Every day | ORAL | 11 refills | Status: DC
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
## 2017-07-30 ENCOUNTER — Other Ambulatory Visit: Payer: Self-pay

## 2017-07-30 ENCOUNTER — Inpatient Hospital Stay
Admission: EM | Admit: 2017-07-30 | Discharge: 2017-08-04 | DRG: 433 | Disposition: A | Discharge status: Skilled Nursing Facility | Payer: Medicaid Other | Attending: Internal Medicine | Admitting: Internal Medicine

## 2017-07-30 DIAGNOSIS — E119 Type 2 diabetes mellitus without complications: Secondary | ICD-10-CM | POA: Diagnosis present

## 2017-07-30 DIAGNOSIS — I110 Hypertensive heart disease with heart failure: Secondary | ICD-10-CM | POA: Diagnosis present

## 2017-07-30 DIAGNOSIS — R443 Hallucinations, unspecified: Secondary | ICD-10-CM | POA: Diagnosis not present

## 2017-07-30 DIAGNOSIS — R188 Other ascites: Secondary | ICD-10-CM

## 2017-07-30 DIAGNOSIS — K219 Gastro-esophageal reflux disease without esophagitis: Secondary | ICD-10-CM | POA: Diagnosis present

## 2017-07-30 DIAGNOSIS — R1310 Dysphagia, unspecified: Secondary | ICD-10-CM | POA: Diagnosis present

## 2017-07-30 DIAGNOSIS — E782 Mixed hyperlipidemia: Secondary | ICD-10-CM | POA: Diagnosis present

## 2017-07-30 DIAGNOSIS — K7200 Acute and subacute hepatic failure without coma: Secondary | ICD-10-CM | POA: Diagnosis present

## 2017-07-30 DIAGNOSIS — K7682 Hepatic encephalopathy: Secondary | ICD-10-CM | POA: Diagnosis present

## 2017-07-30 DIAGNOSIS — Z515 Encounter for palliative care: Secondary | ICD-10-CM | POA: Diagnosis present

## 2017-07-30 DIAGNOSIS — E785 Hyperlipidemia, unspecified: Secondary | ICD-10-CM | POA: Diagnosis present

## 2017-07-30 DIAGNOSIS — F0390 Unspecified dementia without behavioral disturbance: Secondary | ICD-10-CM | POA: Diagnosis present

## 2017-07-30 DIAGNOSIS — F1020 Alcohol dependence, uncomplicated: Secondary | ICD-10-CM | POA: Diagnosis present

## 2017-07-30 DIAGNOSIS — R140 Abdominal distension (gaseous): Secondary | ICD-10-CM

## 2017-07-30 DIAGNOSIS — K7030 Alcoholic cirrhosis of liver without ascites: Secondary | ICD-10-CM | POA: Diagnosis not present

## 2017-07-30 DIAGNOSIS — Z7982 Long term (current) use of aspirin: Secondary | ICD-10-CM | POA: Diagnosis not present

## 2017-07-30 DIAGNOSIS — Z66 Do not resuscitate: Secondary | ICD-10-CM | POA: Diagnosis present

## 2017-07-30 DIAGNOSIS — L89312 Pressure ulcer of right buttock, stage 2: Secondary | ICD-10-CM | POA: Diagnosis present

## 2017-07-30 DIAGNOSIS — R109 Unspecified abdominal pain: Secondary | ICD-10-CM | POA: Diagnosis present

## 2017-07-30 DIAGNOSIS — Z87891 Personal history of nicotine dependence: Secondary | ICD-10-CM

## 2017-07-30 DIAGNOSIS — Z1339 Encounter for screening examination for other mental health and behavioral disorders: Secondary | ICD-10-CM | POA: Diagnosis not present

## 2017-07-30 DIAGNOSIS — E440 Moderate protein-calorie malnutrition: Secondary | ICD-10-CM | POA: Diagnosis present

## 2017-07-30 DIAGNOSIS — Z6827 Body mass index (BMI) 27.0-27.9, adult: Secondary | ICD-10-CM | POA: Diagnosis not present

## 2017-07-30 DIAGNOSIS — Z7951 Long term (current) use of inhaled steroids: Secondary | ICD-10-CM | POA: Diagnosis not present

## 2017-07-30 DIAGNOSIS — Z85038 Personal history of other malignant neoplasm of large intestine: Secondary | ICD-10-CM

## 2017-07-30 DIAGNOSIS — R1084 Generalized abdominal pain: Secondary | ICD-10-CM | POA: Diagnosis not present

## 2017-07-30 DIAGNOSIS — Z85828 Personal history of other malignant neoplasm of skin: Secondary | ICD-10-CM

## 2017-07-30 DIAGNOSIS — I509 Heart failure, unspecified: Secondary | ICD-10-CM | POA: Diagnosis present

## 2017-07-30 DIAGNOSIS — J449 Chronic obstructive pulmonary disease, unspecified: Secondary | ICD-10-CM | POA: Diagnosis present

## 2017-07-30 DIAGNOSIS — K7040 Alcoholic hepatic failure without coma: Principal | ICD-10-CM | POA: Diagnosis present

## 2017-07-30 DIAGNOSIS — K7290 Hepatic failure, unspecified without coma: Secondary | ICD-10-CM

## 2017-07-30 DIAGNOSIS — K7031 Alcoholic cirrhosis of liver with ascites: Secondary | ICD-10-CM | POA: Diagnosis present

## 2017-07-30 LAB — CBC WITH DIFFERENTIAL/PLATELET
BASOS ABS: 0 10*3/uL (ref 0–0.1)
BASOS PCT: 1 %
EOS ABS: 0.1 10*3/uL (ref 0–0.7)
EOS PCT: 2 %
HCT: 32.4 % — ABNORMAL LOW (ref 40.0–52.0)
Hemoglobin: 10.8 g/dL — ABNORMAL LOW (ref 13.0–18.0)
Lymphocytes Relative: 9 %
Lymphs Abs: 0.4 10*3/uL — ABNORMAL LOW (ref 1.0–3.6)
MCH: 35.8 pg — ABNORMAL HIGH (ref 26.0–34.0)
MCHC: 33.4 g/dL (ref 32.0–36.0)
MCV: 107.2 fL — ABNORMAL HIGH (ref 80.0–100.0)
MONO ABS: 0.4 10*3/uL (ref 0.2–1.0)
Monocytes Relative: 10 %
Neutro Abs: 3.2 10*3/uL (ref 1.4–6.5)
Neutrophils Relative %: 78 %
PLATELETS: 90 10*3/uL — AB (ref 150–440)
RBC: 3.02 MIL/uL — AB (ref 4.40–5.90)
RDW: 18.8 % — AB (ref 11.5–14.5)
WBC: 4.2 10*3/uL (ref 3.8–10.6)

## 2017-07-30 LAB — URINALYSIS, COMPLETE (UACMP) WITH MICROSCOPIC
BACTERIA UA: NONE SEEN
BILIRUBIN URINE: NEGATIVE
GLUCOSE, UA: NEGATIVE mg/dL
Hgb urine dipstick: NEGATIVE
KETONES UR: NEGATIVE mg/dL
Leukocytes, UA: NEGATIVE
NITRITE: NEGATIVE
PH: 6 (ref 5.0–8.0)
PROTEIN: NEGATIVE mg/dL
Specific Gravity, Urine: 1.02 (ref 1.005–1.030)
Squamous Epithelial / LPF: NONE SEEN

## 2017-07-30 LAB — URINE DRUG SCREEN, QUALITATIVE (ARMC ONLY)
Amphetamines, Ur Screen: NOT DETECTED
BARBITURATES, UR SCREEN: NOT DETECTED
BENZODIAZEPINE, UR SCRN: NOT DETECTED
Cannabinoid 50 Ng, Ur ~~LOC~~: NOT DETECTED
Cocaine Metabolite,Ur ~~LOC~~: NOT DETECTED
MDMA (Ecstasy)Ur Screen: NOT DETECTED
Methadone Scn, Ur: NOT DETECTED
OPIATE, UR SCREEN: NOT DETECTED
PHENCYCLIDINE (PCP) UR S: NOT DETECTED
Tricyclic, Ur Screen: POSITIVE — AB

## 2017-07-30 LAB — PROTIME-INR
INR: 1.71
PROTHROMBIN TIME: 19.9 s — AB (ref 11.4–15.2)

## 2017-07-30 LAB — AMMONIA: AMMONIA: 127 umol/L — AB (ref 9–35)

## 2017-07-30 LAB — COMPREHENSIVE METABOLIC PANEL
ALK PHOS: 91 U/L (ref 38–126)
ALT: 43 U/L (ref 17–63)
AST: 82 U/L — AB (ref 15–41)
Albumin: 2.5 g/dL — ABNORMAL LOW (ref 3.5–5.0)
Anion gap: 11 (ref 5–15)
BUN: 18 mg/dL (ref 6–20)
CALCIUM: 8.8 mg/dL — AB (ref 8.9–10.3)
CHLORIDE: 109 mmol/L (ref 101–111)
CO2: 17 mmol/L — ABNORMAL LOW (ref 22–32)
CREATININE: 1.05 mg/dL (ref 0.61–1.24)
Glucose, Bld: 220 mg/dL — ABNORMAL HIGH (ref 65–99)
Potassium: 3.9 mmol/L (ref 3.5–5.1)
Sodium: 137 mmol/L (ref 135–145)
Total Bilirubin: 5.9 mg/dL — ABNORMAL HIGH (ref 0.3–1.2)
Total Protein: 7 g/dL (ref 6.5–8.1)

## 2017-07-30 LAB — LIPASE, BLOOD: LIPASE: 57 U/L — AB (ref 11–51)

## 2017-07-30 LAB — ACETAMINOPHEN LEVEL: Acetaminophen (Tylenol), Serum: <10 ug/mL — ABNORMAL LOW (ref 10–30)

## 2017-07-30 LAB — APTT: APTT: 36 s (ref 24–36)

## 2017-07-30 LAB — ETHANOL

## 2017-07-30 LAB — SALICYLATE LEVEL: Salicylate Lvl: <7 mg/dL (ref 2.8–30.0)

## 2017-07-30 MED ORDER — LACTULOSE ENEMA
300.0000 mL | Freq: Once | ORAL | Status: DC
  Filled 2017-07-30: qty 300

## 2017-07-30 MED ORDER — KETOROLAC TROMETHAMINE 30 MG/ML IJ SOLN
15.0000 mg | INTRAMUSCULAR | Status: AC
  Administered 2017-07-30: 15 mg via INTRAVENOUS
  Filled 2017-07-30: qty 1

## 2017-07-30 MED ORDER — PANTOPRAZOLE SODIUM 40 MG IV SOLR
40.0000 mg | Freq: Once | INTRAVENOUS | Status: AC
  Administered 2017-07-30: 40 mg via INTRAVENOUS
  Filled 2017-07-30: qty 40

## 2017-07-30 MED ORDER — CEFTRIAXONE SODIUM 2 G IJ SOLR
2.0000 g | Freq: Once | INTRAMUSCULAR | Status: AC
  Administered 2017-07-30: 2 g via INTRAVENOUS
  Filled 2017-07-30 (×2): qty 2

## 2017-07-30 MED ORDER — LACTULOSE 10 GM/15ML PO SOLN
20.0000 g | Freq: Once | ORAL | Status: AC
  Administered 2017-07-30: 20 g via ORAL
  Filled 2017-07-30: qty 30

## 2017-07-30 MED ORDER — SODIUM CHLORIDE 0.9 % IV BOLUS (SEPSIS)
1000.0000 mL | Freq: Once | INTRAVENOUS | Status: AC
  Administered 2017-07-30: 1000 mL via INTRAVENOUS
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
## 2017-07-30 NOTE — ED Triage Notes (Signed)
Pt arrives via [facility] with complaints of abdominal pain. Family called EMS d/t the pain as well as a 230 ammonia level, per family. Pt abdomen is distended, taut and tender. Skin and eyes have yellow-ish tint.
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
## 2017-07-30 NOTE — ED Notes (Signed)
Delay in ABX administration d/t not having it from pharmacy. This RN spoke with pharmacy to request it just now.

## 2017-07-30 NOTE — ED Notes (Signed)
Per daughter at bedside, pt's health declined on September 2, when he was dx with alcoholic liver disease. Pt daughter states that pt's confusion now is worse than normal, but states that he has been more and more confused.

## 2017-07-31 LAB — AMMONIA: AMMONIA: 114 umol/L — AB (ref 9–35)

## 2017-07-31 LAB — MRSA PCR SCREENING: MRSA BY PCR: NEGATIVE

## 2017-07-31 MED ORDER — FUROSEMIDE 40 MG PO TABS
60.0000 mg | ORAL_TABLET | Freq: Every day | ORAL | Status: DC
  Administered 2017-07-31 – 2017-08-04 (×5): 60 mg via ORAL
  Filled 2017-07-31 (×5): qty 1

## 2017-07-31 MED ORDER — SODIUM CHLORIDE 0.9% FLUSH: 3.0000 mL | INTRAVENOUS | Status: DC | PRN

## 2017-07-31 MED ORDER — PANTOPRAZOLE SODIUM 40 MG PO TBEC
40.0000 mg | DELAYED_RELEASE_TABLET | Freq: Every day | ORAL | Status: DC
  Administered 2017-07-31 – 2017-08-04 (×5): 40 mg via ORAL
  Filled 2017-07-31 (×5): qty 1

## 2017-07-31 MED ORDER — ACETAMINOPHEN 650 MG RE SUPP: 650.0000 mg | Freq: Four times a day (QID) | RECTAL | Status: DC | PRN

## 2017-07-31 MED ORDER — FOLIC ACID 1 MG PO TABS
1.0000 mg | ORAL_TABLET | Freq: Every day | ORAL | Status: DC
  Administered 2017-07-31 – 2017-08-04 (×5): 1 mg via ORAL
  Filled 2017-07-31 (×5): qty 1

## 2017-07-31 MED ORDER — LACTULOSE 10 GM/15ML PO SOLN
40.0000 g | Freq: Three times a day (TID) | ORAL | Status: DC
  Administered 2017-07-31: 40 g via ORAL
  Filled 2017-07-31: qty 60

## 2017-07-31 MED ORDER — SODIUM CHLORIDE 0.9 % IV SOLN: 250.0000 mL | INTRAVENOUS | Status: DC | PRN

## 2017-07-31 MED ORDER — SPIRONOLACTONE 25 MG PO TABS
100.0000 mg | ORAL_TABLET | Freq: Every day | ORAL | Status: DC
  Administered 2017-07-31 – 2017-08-04 (×5): 100 mg via ORAL
  Filled 2017-07-31 (×5): qty 4

## 2017-07-31 MED ORDER — VITAMIN B-1 100 MG PO TABS
100.0000 mg | ORAL_TABLET | Freq: Every day | ORAL | Status: DC
  Administered 2017-07-31 – 2017-08-04 (×5): 100 mg via ORAL
  Filled 2017-07-31 (×5): qty 1

## 2017-07-31 MED ORDER — ADULT MULTIVITAMIN W/MINERALS CH
1.0000 | ORAL_TABLET | Freq: Every day | ORAL | Status: DC
  Administered 2017-07-31 – 2017-08-04 (×5): 1 via ORAL
  Filled 2017-07-31 (×5): qty 1

## 2017-07-31 MED ORDER — ONDANSETRON HCL 4 MG PO TABS: 4.0000 mg | ORAL_TABLET | Freq: Four times a day (QID) | ORAL | Status: DC | PRN

## 2017-07-31 MED ORDER — ONDANSETRON HCL 4 MG/2ML IJ SOLN: 4.0000 mg | Freq: Four times a day (QID) | INTRAMUSCULAR | Status: DC | PRN

## 2017-07-31 MED ORDER — LACTULOSE 10 GM/15ML PO SOLN
40.0000 g | Freq: Four times a day (QID) | ORAL | Status: DC
  Administered 2017-07-31 – 2017-08-01 (×4): 40 g via ORAL
  Filled 2017-07-31 (×4): qty 60

## 2017-07-31 MED ORDER — RIFAXIMIN 550 MG PO TABS
550.0000 mg | ORAL_TABLET | Freq: Two times a day (BID) | ORAL | Status: DC
  Administered 2017-07-31 – 2017-08-04 (×8): 550 mg via ORAL
  Filled 2017-07-31 (×9): qty 1

## 2017-07-31 MED ORDER — SODIUM CHLORIDE 0.9% FLUSH: 3.0000 mL | Freq: Two times a day (BID) | INTRAVENOUS | Status: DC

## 2017-07-31 MED ORDER — MOMETASONE FURO-FORMOTEROL FUM 100-5 MCG/ACT IN AERO
2.0000 | INHALATION_SPRAY | Freq: Two times a day (BID) | RESPIRATORY_TRACT | Status: DC
  Administered 2017-07-31 – 2017-08-04 (×8): 2 via RESPIRATORY_TRACT
  Filled 2017-07-31 (×2): qty 8.8

## 2017-07-31 MED ORDER — ASPIRIN EC 81 MG PO TBEC
81.0000 mg | DELAYED_RELEASE_TABLET | Freq: Every day | ORAL | Status: DC
  Administered 2017-07-31 – 2017-08-04 (×5): 81 mg via ORAL
  Filled 2017-07-31 (×5): qty 1

## 2017-07-31 MED ORDER — ACETAMINOPHEN 325 MG PO TABS: 650.0000 mg | ORAL_TABLET | Freq: Four times a day (QID) | ORAL | Status: DC | PRN

## 2017-07-31 MED ORDER — HYDROCODONE-ACETAMINOPHEN 5-325 MG PO TABS: 1.0000 | ORAL_TABLET | ORAL | Status: DC | PRN

## 2017-07-31 MED ORDER — ALBUTEROL SULFATE (2.5 MG/3ML) 0.083% IN NEBU: 2.5000 mg | INHALATION_SOLUTION | RESPIRATORY_TRACT | Status: DC | PRN

## 2017-07-31 MED ORDER — SODIUM CHLORIDE 0.9% FLUSH
3.0000 mL | Freq: Two times a day (BID) | INTRAVENOUS | Status: DC
  Administered 2017-07-31 – 2017-08-03 (×8): 3 mL via INTRAVENOUS

## 2017-07-31 NOTE — Progress Notes (Signed)
Pt is staying until Monday, increased Lactulose and needing to have bowel movement. Appetite good. No complaints of pain.

## 2017-07-31 NOTE — Progress Notes (Signed)
Patient ID: Gregory Marquez, male   DOB: 1955-08-29, 61 y.o.   MRN: [id number]  [employer] PROGRESS NOTE  Gregory Marquez:[id number] DOB: [birth year] DOA: 07/30/2017 PCP: [facility] [place]  HPI/Subjective: Patient has not had a bowel movement since he came into the hospital. Some nausea. States he is not well. Patient is a poor historian.  Objective: Vitals:   07/31/17 0440 07/31/17 0940  BP: 126/70 137/74  Pulse: 83 77  Resp: 19 18  Temp: 97.7 F (36.5 C) (!) 97.5 F (36.4 C)  SpO2: 100% 98%    Intake/Output Summary (Last 24 hours) at 07/31/2017 1231 Last data filed at 07/30/2017 2136 Gross per 24 hour  Intake 1050 ml  Output -  Net 1050 ml   Filed Weights   07/30/17 1948 07/31/17 0117  Weight: 81.6 kg (180 lb) 76.8 kg (169 lb 4.8 oz)    ROS: Review of Systems  Constitutional: Negative for chills and fever.  Eyes: Negative for blurred vision.  Respiratory: Negative for cough and shortness of breath.   Cardiovascular: Negative for chest pain.  Gastrointestinal: Positive for nausea. Negative for abdominal pain, constipation, diarrhea and vomiting.  Genitourinary: Negative for dysuria.  Musculoskeletal: Negative for joint pain.  Neurological: Negative for dizziness and headaches.   Exam: Physical Exam  HENT:  Nose: No mucosal edema.  Mouth/Throat: No oropharyngeal exudate or posterior oropharyngeal edema.  Eyes: Conjunctivae, EOM and lids are normal. Pupils are equal, round, and reactive to light.  Neck: No JVD present. Carotid bruit is not present. No edema present. No thyroid mass and no thyromegaly present.  Cardiovascular: S1 normal and S2 normal. Exam reveals no gallop.  No murmur heard. Pulses:      Dorsalis pedis pulses are 2+ on the right side, and 2+ on the left side.  Respiratory: No respiratory distress. He has no wheezes. He has no rhonchi. He has no rales.  GI: Soft. Bowel sounds are normal. He exhibits distension. There is no  tenderness.  Musculoskeletal:       Right ankle: He exhibits no swelling.       Left ankle: He exhibits no swelling.  Lymphadenopathy:    He has no cervical adenopathy.  Neurological: He is alert.  Positive for flapping asterixis  Skin: Skin is warm. Nails show no clubbing.  Psychiatric:  Slowed with speech and answers      Data Reviewed: Basic Metabolic Panel: Recent Labs  Lab 07/30/17 1934  NA 137  K 3.9  CL 109  CO2 17*  GLUCOSE 220*  BUN 18  CREATININE 1.05  CALCIUM 8.8*   Liver Function Tests: Recent Labs  Lab 07/30/17 1934  AST 82*  ALT 43  ALKPHOS 91  BILITOT 5.9*  PROT 7.0  ALBUMIN 2.5*   Recent Labs  Lab 07/30/17 1934  LIPASE 57*   Recent Labs  Lab 07/30/17 1934 07/31/17 0635  AMMONIA 127* 114*   CBC: Recent Labs  Lab 07/30/17 1934  WBC 4.2  NEUTROABS 3.2  HGB 10.8*  HCT 32.4*  MCV 107.2*  PLT 90*     Recent Results (from the past 240 hour(s))  Blood culture (routine x 2)     Status: None (Preliminary result)   Collection Time: 07/30/17  7:51 PM  Result Value Ref Range Status   Specimen Description BLOOD RIGHT ANTECUBITAL  Final   Special Requests   Final    BOTTLES DRAWN AEROBIC AND ANAEROBIC Blood Culture results may not be optimal due to  an excessive volume of blood received in culture bottles   Culture NO GROWTH < 12 HOURS  Final   Report Status PENDING  Incomplete  MRSA PCR Screening     Status: None   Collection Time: 07/31/17  6:29 AM  Result Value Ref Range Status   MRSA by PCR NEGATIVE NEGATIVE Final    Comment:        The GeneXpert MRSA Assay (FDA approved for NASAL specimens only), is one component of a comprehensive MRSA colonization surveillance program. It is not intended to diagnose MRSA infection nor to guide or monitor treatment for MRSA infections.      Studies: No results found.  Scheduled Meds: . aspirin EC  81 mg Oral Daily  . folic acid  1 mg Oral Daily  . furosemide  60 mg Oral Daily  .  lactulose  40 g Oral QID  . lactulose  300 mL Rectal Once  . mometasone-formoterol  2 puff Inhalation BID  . multivitamin with minerals  1 tablet Oral Daily  . pantoprazole  40 mg Oral Daily  . rifaximin  550 mg Oral BID  . sodium chloride flush  3 mL Intravenous Q12H  . spironolactone  100 mg Oral Daily  . thiamine  100 mg Oral Daily   Continuous Infusions: . sodium chloride      Assessment/Plan:  1. Acute hepatic encephalopathy.  Continue Xifaxan.  Increase lactulose to 40 g 4 times a day.  She needs to have 3 bowel movements a day.  Has not had a bowel movement yet. 2. History of alcoholic cirrhosis, thrombocytopenia, auto anticoagulation, ascites 3. Chronic dementia with mild cognitive impairment. 4. Chronic GERD on PPI  5. abdominal pain as per admitting note.  Patient put on empiric Rocephin.  Not sure if I need this. 6. Ascites on Spironolactone and Lasix  Code Status:     Code Status Orders  (From admission, onward)        Start     Ordered   07/31/17 0116  Do not attempt resuscitation (DNR)  Continuous    Question Answer Comment  In the event of cardiac or respiratory ARREST Do not call a "code blue"   In the event of cardiac or respiratory ARREST Do not perform Intubation, CPR, defibrillation or ACLS   In the event of cardiac or respiratory ARREST Use medication by any route, position, wound care, and other measures to relive pain and suffering. May use oxygen, suction and manual treatment of airway obstruction as needed for comfort.      07/31/17 0115    Code Status History    Date Active Date Inactive Code Status Order ID Comments User Context   07/13/2017 18:44 07/14/2017 20:31 DNR [id number]  Gorden Harms, MD Inpatient   07/06/2017 02:45 07/08/2017 23:09 DNR [id number]  Salary, Avel Peace, MD Inpatient     Family Communication: Unable to contact family on the phone numbers in the computer Disposition Plan: To be  determined  Antibiotics:  Rocephin  Time spent: 28 minutes  [place]

## 2017-08-01 LAB — COMPREHENSIVE METABOLIC PANEL
ALT: 39 U/L (ref 17–63)
ANION GAP: 8 (ref 5–15)
AST: 63 U/L — ABNORMAL HIGH (ref 15–41)
Albumin: 2.2 g/dL — ABNORMAL LOW (ref 3.5–5.0)
Alkaline Phosphatase: 84 U/L (ref 38–126)
BUN: 19 mg/dL (ref 6–20)
CHLORIDE: 112 mmol/L — AB (ref 101–111)
CO2: 19 mmol/L — AB (ref 22–32)
CREATININE: 0.79 mg/dL (ref 0.61–1.24)
Calcium: 8.4 mg/dL — ABNORMAL LOW (ref 8.9–10.3)
Glucose, Bld: 138 mg/dL — ABNORMAL HIGH (ref 65–99)
POTASSIUM: 3.7 mmol/L (ref 3.5–5.1)
SODIUM: 139 mmol/L (ref 135–145)
Total Bilirubin: 4.7 mg/dL — ABNORMAL HIGH (ref 0.3–1.2)
Total Protein: 6.1 g/dL — ABNORMAL LOW (ref 6.5–8.1)

## 2017-08-01 LAB — AMMONIA: AMMONIA: 95 umol/L — AB (ref 9–35)

## 2017-08-01 MED ORDER — LACTULOSE 10 GM/15ML PO SOLN
40.0000 g | Freq: Three times a day (TID) | ORAL | Status: DC
  Administered 2017-08-01: 40 g via ORAL
  Filled 2017-08-01: qty 60

## 2017-08-01 NOTE — Plan of Care (Signed)
Son and dgt in [employer] and met with Dr.Wieting. Pt continues to be confused/talking to self/out loud with disassociated ideas. Has had formed to now loose brown stools from lactulose therapy. Eating well/drinking adequate po's.  Ammonia level slowly decreasing and today is 95. Pt had dried blood in nose which family was picking out-education done with K&Y lubricant applied gently in nares.

## 2017-08-01 NOTE — Progress Notes (Signed)
RN making rounds- hear pt trying to cough. Pt sitting up in bed eating supper with coughing.   Lips dusky/warm/dry.  Pt had appearance of choking, but able to speak. Advised pt to hard cough with clearing, color returned to normal. VS's acceptable. States he is OK. Reassured with no acute changes noted.

## 2017-08-01 NOTE — Progress Notes (Addendum)
Patient ID: Gregory Marquez, male   DOB: 10/06/1955, 61 y.o.   MRN: [id number]   [employer] PROGRESS NOTE  Gregory Marquez DOB: 1955-12-17 DOA: 07/30/2017 PCP: [facility] [place]  HPI/Subjective: Patient has not had a bowel movement since he came into the hospital. Some nausea. States he is not well. Patient is a poor historian.  Objective: Vitals:   07/31/17 2031 08/01/17 0711  BP: (!) 145/70 (!) 152/85  Pulse: (!) 101 (!) 104  Resp:  20  Temp: 97.7 F (36.5 C) 98 F (36.7 C)  SpO2: 100% 98%    Intake/Output Summary (Last 24 hours) at 08/01/2017 1339 Last data filed at 08/01/2017 0900 Gross per 24 hour  Intake 600 ml  Output 3 ml  Net 597 ml   Filed Weights   07/30/17 1948 07/31/17 0117 08/01/17 0500  Weight: 81.6 kg (180 lb) 76.8 kg (169 lb 4.8 oz) 78.7 kg (173 lb 9.6 oz)    ROS: Review of Systems  Unable to perform ROS: Dementia  Respiratory: Negative for shortness of breath.   Cardiovascular: Negative for chest pain.  Gastrointestinal: Positive for diarrhea. Negative for abdominal pain and nausea.   Exam: Physical Exam  HENT:  Nose: No mucosal edema.  Mouth/Throat: No oropharyngeal exudate or posterior oropharyngeal edema.  Eyes: Conjunctivae, EOM and lids are normal. Pupils are equal, round, and reactive to light.  Neck: No JVD present. Carotid bruit is not present. No edema present. No thyroid mass and no thyromegaly present.  Cardiovascular: S1 normal and S2 normal. Exam reveals no gallop.  No murmur heard. Pulses:      Dorsalis pedis pulses are 2+ on the right side, and 2+ on the left side.  Respiratory: No respiratory distress. He has no wheezes. He has no rhonchi. He has no rales.  GI: Soft. Bowel sounds are normal. He exhibits distension. There is no tenderness.  Musculoskeletal:       Right ankle: He exhibits no swelling.       Left ankle: He exhibits no swelling.  Lymphadenopathy:    He has no cervical adenopathy.   Neurological: He is alert.  Positive for flapping asterixis  Skin: Skin is warm. Nails show no clubbing.  Stage II decubiti seen on right buttock  Psychiatric:  Slowed with speech and answers      Data Reviewed: Basic Metabolic Panel: Recent Labs  Lab 07/30/17 1934 08/01/17 0509  NA 137 139  K 3.9 3.7  CL 109 112*  CO2 17* 19*  GLUCOSE 220* 138*  BUN 18 19  CREATININE 1.05 0.79  CALCIUM 8.8* 8.4*   Liver Function Tests: Recent Labs  Lab 07/30/17 1934 08/01/17 0509  AST 82* 63*  ALT 43 39  ALKPHOS 91 84  BILITOT 5.9* 4.7*  PROT 7.0 6.1*  ALBUMIN 2.5* 2.2*   Recent Labs  Lab 07/30/17 1934  LIPASE 57*   Recent Labs  Lab 07/30/17 1934 07/31/17 0635 08/01/17 0509  AMMONIA 127* 114* 95*   CBC: Recent Labs  Lab 07/30/17 1934  WBC 4.2  NEUTROABS 3.2  HGB 10.8*  HCT 32.4*  MCV 107.2*  PLT 90*     Recent Results (from the past 240 hour(s))  Blood culture (routine x 2)     Status: None (Preliminary result)   Collection Time: 07/30/17  7:51 PM  Result Value Ref Range Status   Specimen Description BLOOD RIGHT ANTECUBITAL  Final   Special Requests   Final    BOTTLES  DRAWN AEROBIC AND ANAEROBIC Blood Culture results may not be optimal due to an excessive volume of blood received in culture bottles   Culture NO GROWTH 2 DAYS  Final   Report Status PENDING  Incomplete  MRSA PCR Screening     Status: None   Collection Time: 07/31/17  6:29 AM  Result Value Ref Range Status   MRSA by PCR NEGATIVE NEGATIVE Final    Comment:        The GeneXpert MRSA Assay (FDA approved for NASAL specimens only), is one component of a comprehensive MRSA colonization surveillance program. It is not intended to diagnose MRSA infection nor to guide or monitor treatment for MRSA infections.       Scheduled Meds: . aspirin EC  81 mg Oral Daily  . folic acid  1 mg Oral Daily  . furosemide  60 mg Oral Daily  . lactulose  40 g Oral TID  . lactulose  300 mL Rectal Once   . mometasone-formoterol  2 puff Inhalation BID  . multivitamin with minerals  1 tablet Oral Daily  . pantoprazole  40 mg Oral Daily  . rifaximin  550 mg Oral BID  . sodium chloride flush  3 mL Intravenous Q12H  . spironolactone  100 mg Oral Daily  . thiamine  100 mg Oral Daily   Continuous Infusions:   Assessment/Plan:  1. Acute hepatic encephalopathy.  Continue Xifaxan.  Decrease lactulose to 40 g 3 times a day, since the patient had 3 bowel movements already this morning.  The patient needs to have 3 bowel movements a day.  2. History of alcoholic cirrhosis, thrombocytopenia, auto anticoagulation, ascites 3. Chronic dementia with mild cognitive impairment. 4. Chronic GERD on PPI  5. Ascites on Spironolactone and Lasix.  Family would like [place] to evaluate if there is any fluid to draw off his abdomen.  Will order an ultrasound-guided paracentesis for tomorrow 6. Weakness.  Physical therapy evaluation 7. Stage II decubiti on right buttock 8. Family states that he has been followed by palliative care at facility.  They can convert to hospice if the patient does decline.  Code Status:     Code Status Orders  (From admission, onward)        Start     Ordered   07/31/17 0116  Do not attempt resuscitation (DNR)  Continuous    Question Answer Comment  In the event of cardiac or respiratory ARREST Do not call a "code blue"   In the event of cardiac or respiratory ARREST Do not perform Intubation, CPR, defibrillation or ACLS   In the event of cardiac or respiratory ARREST Use medication by any route, position, wound care, and other measures to relive pain and suffering. May use oxygen, suction and manual treatment of airway obstruction as needed for comfort.      07/31/17 0115    Code Status History    Date Active Date Inactive Code Status Order ID Comments User Context   07/13/2017 18:44 07/14/2017 20:31 DNR [id number]  Gorden Harms, MD Inpatient   07/06/2017 02:45 07/08/2017  23:09 DNR [id number]  Salary, Avel Peace, MD Inpatient     Family Communication: Spoke with son and daughter-in-law at the bedside.  Son is going to get power of attorney this month. Disposition Plan: To be determined  Antibiotics:  Rocephin  Time spent: 28 minutes  [place]

## 2017-08-02 ENCOUNTER — Inpatient Hospital Stay: Payer: Medicaid Other

## 2017-08-02 LAB — BODY FLUID CELL COUNT WITH DIFFERENTIAL
EOS FL: 1 %
LYMPHS FL: 17 %
Monocyte-Macrophage-Serous Fluid: 20 %
NEUTROPHIL FLUID: 62 %
OTHER CELLS FL: 0 %
Total Nucleated Cell Count, Fluid: 188 cu mm

## 2017-08-02 LAB — GLUCOSE, PLEURAL OR PERITONEAL FLUID: GLUCOSE FL: 93 mg/dL

## 2017-08-02 LAB — PROTEIN, PLEURAL OR PERITONEAL FLUID

## 2017-08-02 MED ORDER — VITAMIN B-1 100 MG PO TABS: 100.0000 mg | ORAL_TABLET | Freq: Every day | ORAL | Status: DC

## 2017-08-02 MED ORDER — LORAZEPAM 1 MG PO TABS: 1.0000 mg | ORAL_TABLET | Freq: Four times a day (QID) | ORAL | Status: DC | PRN

## 2017-08-02 MED ORDER — ADULT MULTIVITAMIN W/MINERALS CH: 1.0000 | ORAL_TABLET | Freq: Every day | ORAL | Status: DC

## 2017-08-02 MED ORDER — THIAMINE HCL 100 MG/ML IJ SOLN: 100.0000 mg | Freq: Every day | INTRAMUSCULAR | Status: DC

## 2017-08-02 MED ORDER — HALOPERIDOL LACTATE 5 MG/ML IJ SOLN
1.0000 mg | Freq: Four times a day (QID) | INTRAMUSCULAR | Status: DC | PRN
  Administered 2017-08-02 – 2017-08-03 (×2): 1 mg via INTRAVENOUS
  Filled 2017-08-02 (×3): qty 1

## 2017-08-02 MED ORDER — LACTULOSE 10 GM/15ML PO SOLN
40.0000 g | Freq: Four times a day (QID) | ORAL | Status: DC
  Administered 2017-08-02 – 2017-08-04 (×6): 40 g
  Filled 2017-08-02 (×7): qty 60

## 2017-08-02 MED ORDER — LORAZEPAM 2 MG/ML IJ SOLN
1.0000 mg | Freq: Four times a day (QID) | INTRAMUSCULAR | Status: DC | PRN
  Administered 2017-08-02: 03:00:00 1 mg via INTRAVENOUS
  Filled 2017-08-02: qty 1

## 2017-08-02 MED ORDER — QUETIAPINE FUMARATE 25 MG PO TABS
25.0000 mg | ORAL_TABLET | Freq: Every day | ORAL | Status: DC
  Administered 2017-08-02: 25 mg via ORAL
  Filled 2017-08-02 (×2): qty 1

## 2017-08-02 MED ORDER — ENSURE ENLIVE PO LIQD
237.0000 mL | Freq: Three times a day (TID) | ORAL | Status: DC
  Administered 2017-08-02 – 2017-08-04 (×4): 237 mL via ORAL

## 2017-08-02 MED ORDER — FOLIC ACID 1 MG PO TABS: 1.0000 mg | ORAL_TABLET | Freq: Every day | ORAL | Status: DC

## 2017-08-02 NOTE — Clinical Social Work Note (Signed)
Clinical Social Work Assessment  Patient Details  Name: Gregory Marquez MRN: [id number] Date of Birth: [birth year]  Date of referral:  08/02/17               Reason for consult:  Other (Comment Required)(From [name] )                Permission sought to share information with:  Chartered certified accountant granted to share information::  Yes, Verbal Permission Granted  Name::      Public affairs consultant::   [place]   Relationship::     Contact Information:     Housing/Transportation Living arrangements for the past 2 months:  [place], [place] of Information:  Facility Patient Interpreter Needed:  None Criminal Activity/Legal Involvement Pertinent to Current Situation/Hospitalization:  No - Comment as needed Significant Relationships:  Adult Children Lives with:  Facility Resident Do you feel safe going back to the place where you live?  Yes Need for family participation in patient care:  Yes (Comment)  Care giving concerns:  Patient is a resident at [facility].    Social Worker assessment / plan:  Holiday representative (Gregory Marquez) reviewed chart and noted that patient is from [name]. CSW is familiar with patient from previous admissions. Patient was placed at [facility] in November 2018. Per [employer] liaison patient can return when stable. CSW attempted to meet with patient however he was asleep and no family was at bedside. CSW contacted patient's son Gregory Marquez and he is pursing guardianship for patient and will go to court this week. Gregory Marquez is agreeable for patient to return to [name]. FL2 complete. CSW will continue to follow and assist as needed.   Employment status:  Disabled (Comment on whether or not currently receiving Disability), Retired Forensic scientist:  Medicaid In [place] PT Recommendations:  Not assessed at this time Information /  Referral to community resources:  [place]  Patient/Family's Response to care:  Per son he is pursing guardianship and agreeable for patient to return to [name].  Patient/Family's Understanding of and Emotional Response to Diagnosis, Current Treatment, and Prognosis: Patient's son was very pleasant and thanked CSW for assistance.   Emotional Assessment Appearance:  Appears stated age Attitude/Demeanor/Rapport:  Unable to Assess Affect (typically observed):  Unable to Assess Orientation:  Oriented to Self Alcohol / Substance use:  Not Applicable Psych involvement (Current and /or in the community):  No (Comment)  Discharge Needs  Concerns to be addressed:  Discharge Planning Concerns Readmission within the last 30 days:  Yes Current discharge risk:  Chronically ill, Cognitively Impaired, Dependent with Mobility Barriers to Discharge:  Continued Medical Work up   [employer], Gregory Beets, LCSW 08/02/2017, 5:24 PM

## 2017-08-02 NOTE — Progress Notes (Signed)
PT Cancellation Note  Patient Details Name: Gregory Marquez MRN: [id number] DOB: November 22, 1955   Cancelled Treatment:    Reason Eval/Treat Not Completed: Other (comment).  PT consult received.  Chart reviewed.  Pt s/p paracentesis this morning.  Also noted in chart pt recently received IV Haldol.  Per discussion with nursing, will hold PT today (d/t agitation concerns) and will re-attempt PT eval tomorrow as appropriate.  Leitha Bleak, PT 08/02/17, 1:36 PM [phone]

## 2017-08-02 NOTE — Progress Notes (Signed)
Patient ID: Gregory Marquez, male   DOB: Jul 28, 1956, 61 y.o.   MRN: [id number]  [employer] PROGRESS NOTE  Gregory Marquez JJO:[id number] DOB: 1956/02/16 DOA: 07/30/2017 [facility]CP: [facility]  HPI/Subjective: Patient seen earlier.  The patient had his paracentesis.  The patient states that they killed his baby.  He needs to go home to straighten things up.  I did call the son on the phone and the patient was angry today.   Objective: Vitals:   08/02/17 0920 08/02/17 0938  BP: 124/60 124/74  Pulse:    Resp: 18 18  Temp:    SpO2: 96% 100%    Filed Weights   07/30/17 1948 07/31/17 0117 08/01/17 0500  Weight: 81.6 kg (180 lb) 76.8 kg (169 lb 4.8 oz) 78.7 kg (173 lb 9.6 oz)    ROS: Review of Systems  Unable to perform ROS: Mental status change   Exam: Physical Exam  HENT:  Nose: No mucosal edema.  Mouth/Throat: No oropharyngeal exudate or posterior oropharyngeal edema.  Eyes: Conjunctivae, EOM and lids are normal. Pupils are equal, round, and reactive to light.  Neck: No JVD present. Carotid bruit is not present. No edema present. No thyroid mass and no thyromegaly present.  Cardiovascular: S1 normal and S2 normal. Exam reveals no gallop.  No murmur heard. Pulses:      Dorsalis pedis pulses are 2+ on the right side, and 2+ on the left side.  Respiratory: No respiratory distress. He has no wheezes. He has no rhonchi. He has no rales.  GI: Soft. Bowel sounds are normal. There is no tenderness.  Musculoskeletal:       Right ankle: He exhibits swelling.       Left ankle: He exhibits swelling.  Lymphadenopathy:    He has no cervical adenopathy.  Neurological: He is alert.  Patient moves his extremities on his own.  Skin: Skin is warm. No rash noted. Nails show no clubbing.  Psychiatric: He is actively hallucinating.      Data Reviewed: Basic Metabolic Panel: Recent Labs  Lab 07/30/17 1934 08/01/17 0509  NA 137 139  K 3.9 3.7  CL 109 112*  CO2  17* 19*  GLUCOSE 220* 138*  BUN 18 19  CREATININE 1.05 0.79  CALCIUM 8.8* 8.4*   Liver Function Tests: Recent Labs  Lab 07/30/17 1934 08/01/17 0509  AST 82* 63*  ALT 43 39  ALKPHOS 91 84  BILITOT 5.9* 4.7*  PROT 7.0 6.1*  ALBUMIN 2.5* 2.2*   Recent Labs  Lab 07/30/17 1934  LIPASE 57*   Recent Labs  Lab 07/30/17 1934 07/31/17 0635 08/01/17 0509  AMMONIA 127* 114* 95*   CBC: Recent Labs  Lab 07/30/17 1934  WBC 4.2  NEUTROABS 3.2  HGB 10.8*  HCT 32.4*  MCV 107.2*  PLT 90*     Recent Results (from the past 240 hour(s))  Blood culture (routine x 2)     Status: None (Preliminary result)   Collection Time: 07/30/17  7:51 PM  Result Value Ref Range Status   Specimen Description BLOOD RIGHT ANTECUBITAL  Final   Special Requests   Final    BOTTLES DRAWN AEROBIC AND ANAEROBIC Blood Culture results may not be optimal due to an excessive volume of blood received in culture bottles   Culture NO GROWTH 3 DAYS  Final   Report Status PENDING  Incomplete  MRSA PCR Screening     Status: None   Collection Time: 07/31/17  6:29 AM  Result Value Ref Range Status   MRSA by PCR NEGATIVE NEGATIVE Final    Comment:        The GeneXpert MRSA Assay (FDA approved for NASAL specimens only), is one component of a comprehensive MRSA colonization surveillance program. It is not intended to diagnose MRSA infection nor to guide or monitor treatment for MRSA infections.      Studies: US Paracentesis  Result Date: 08/02/2017 INDICATION: 61 year old with acute hepatic encephalopathy. History of alcoholic cirrhosis of liver with ascites. EXAM: ULTRASOUND GUIDED PARACENTESIS MEDICATIONS: None. COMPLICATIONS: None immediate. PROCEDURE: Patient is confused and cannot gave informed consent. Informed consent was obtained from the patient's mother over the telephone. Initial ultrasound scanning demonstrates a small amount of ascites within the right upper quadrant. The right upper abdomen  was prepped and draped in the usual sterile fashion. 1% lidocaine with was used for local anesthesia. Following this, a 19 gauge, 10-cm, Yueh catheter was introduced. An ultrasound image was saved for documentation purposes. The paracentesis was performed. The catheter was removed and a dressing was applied. The patient tolerated the procedure well without immediate post procedural complication. FINDINGS: A total of approximately 550 mL of yellow fluid was removed. Samples were sent to the laboratory as requested by the clinical team. IMPRESSION: Successful ultrasound-guided paracentesis yielding 550 mL of peritoneal fluid. Electronically Signed   By: Markus Daft M.D.   On: 08/02/2017 09:52    Scheduled Meds: . aspirin EC  81 mg Oral Daily  . folic acid  1 mg Oral Daily  . furosemide  60 mg Oral Daily  . lactulose  40 g Per Tube QID  . lactulose  300 mL Rectal Once  . mometasone-formoterol  2 puff Inhalation BID  . multivitamin with minerals  1 tablet Oral Daily  . pantoprazole  40 mg Oral Daily  . QUEtiapine  25 mg Oral QHS  . rifaximin  550 mg Oral BID  . sodium chloride flush  3 mL Intravenous Q12H  . spironolactone  100 mg Oral Daily  . thiamine  100 mg Oral Daily    Assessment/Plan:  1. Acute hepatic encephalopathy.  Continue Xifaxan.  Increase lactulose to 40 mg 4 times a day.  Patient needs to have 3 bowel movements a day at least. 2. History of alcoholic cirrhosis, thrombocytopenia, auto anticoagulation and ascites. 3. Chronic dementia with mild cognitive impairment.  Patient having hallucinations today.  In speaking with the son the patient did have a baby that was a stillborn.  He may be referring to that. 4. Ascites on Spironolactone and Lasix.  Ultrasound paracentesis to rule out 550 mL.  White blood cell count not high enough to start antibiotic regimen but may be a candidate for antibiotics prophylactically. 5. Weakness.  Physical therapy evaluation 6. Stage II decubiti on  right buttock 7. Palliative care consultation  Code Status:     Code Status Orders  (From admission, onward)        Start     Ordered   07/31/17 0116  Do not attempt resuscitation (DNR)  Continuous    Question Answer Comment  In the event of cardiac or respiratory ARREST Do not call a "code blue"   In the event of cardiac or respiratory ARREST Do not perform Intubation, CPR, defibrillation or ACLS   In the event of cardiac or respiratory ARREST Use medication by any route, position, wound care, and other measures to relive pain and suffering. May use oxygen, suction and manual treatment of  airway obstruction as needed for comfort.      07/31/17 0115    Code Status History    Date Active Date Inactive Code Status Order ID Comments User Context   07/13/2017 18:44 07/14/2017 20:31 DNR [id number]  Gorden Harms, MD Inpatient   07/06/2017 02:45 07/08/2017 23:09 DNR [id number]  Salary, Avel Peace, MD Inpatient     Family Communication: Spoke with son on the phone Disposition Plan: Potentially back to facility with palliative care versus hospice  Consultants:  Palliative care consultation  Time spent: 28 minutes  [place]

## 2017-08-02 NOTE — Care Management (Signed)
Patient is from [facility].  Admitted with Acute hepatic encephalopathy.  Per patient son he has a court date set for Thursday, to try to obtain legal guardianship of patient. CSW is aware of the admission.

## 2017-08-02 NOTE — Progress Notes (Signed)
Patient attempting to get out of bed and started to become aggressive verbally and physically with staff. Security called for assistance and patient assisted back into bed after calming techniques were used. MD Salary notified and MD to place orders to start patient on CIWA protocol due to previous history of alcohol abuse. Nursing staff will continue to monitor for any changes in patient status. Earleen Reaper, RN

## 2017-08-02 NOTE — Procedures (Signed)
US guided paracentesis.  Small amount of fluid around the liver.  Removed 550 ml of yellow fluid.  Minimal blood loss and no immediate complication.

## 2017-08-02 NOTE — Progress Notes (Signed)
Please note patient is currently followed by out patient Palliative at [facility]. CSW [employer] made aware. Flo Shanks RN, BSN, [facility] [place], hospital liaison [phone]

## 2017-08-02 NOTE — Progress Notes (Signed)
Initial Nutrition Assessment  DOCUMENTATION CODES:   Non-severe (moderate) malnutrition in context of chronic illness  INTERVENTION:  Provide Ensure Enlive po TID, each supplement provides 350 kcal and 20 grams of protein.  Continue MVI daily, folic acid 1 mg daily, thiamine 100 mg daily.  NUTRITION DIAGNOSIS:   Moderate Malnutrition related to chronic illness(EtOH abuse, cirrhosis, cognitive impairment) as evidenced by moderate fat depletion, moderate muscle depletion.  GOAL:   Patient will meet greater than or equal to 90% of their needs  MONITOR:   PO intake, Supplement acceptance, Labs, Weight trends, Skin, I & O's  REASON FOR ASSESSMENT:   Consult Assessment of nutrition requirement/status  ASSESSMENT:   61 year old male with PMHx of DM type 2, peripheral neuropathy, vitamin D deficiency, EtOH abuse, alcoholic cirrhosis, CHF, COPD, gout, HLD, anxiety, GERD, hx colon cancer s/p colectomy 12/2009, chronic dementia with mild cognitive impairment who is admitted with acute hepatic encephalopathy.   Attempted to meet with patient at bedside. He was lethargic and unable to stay awake long enough to talk with RD. Per RN he received Haldol today. Called patient's son Raquel Sarna. Shawn reports patient is staying at [facility]. They are unsure how he has been eating but report patient has been more confused lately. Son endorses patient has been losing weight, but is unsure how much he has lost. They have known about the wound patient has for about one week now.  Limited weight history in chart. He was 204.4 lbs on 06/24/2017, but he had ascites at that visit.   Meal Completion: bites except 50% of lunch on 12/8 and 90% of breakfast on 12/9  Medications reviewed and include: folic acid 1 mg daily, Lasix 60 mg daily, lactulose 40 grams QID, MVI daily, pantoprazole, thiamine 100 mg daily.  Labs reviewed: Chloride 112, CO2 19, Ammonia 95.  Discussed with RN.  NUTRITION - FOCUSED  PHYSICAL EXAM:    Most Recent Value  Orbital Region  Mild depletion  Upper Arm Region  Moderate depletion  Thoracic and Lumbar Region  Moderate depletion  Buccal Region  Mild depletion  Temple Region  Mild depletion  Clavicle Bone Region  Moderate depletion  Clavicle and Acromion Bone Region  Moderate depletion  Scapular Bone Region  Moderate depletion  Dorsal Hand  Moderate depletion  Patellar Region  Moderate depletion  Anterior Thigh Region  Moderate depletion  Posterior Calf Region  Moderate depletion  Edema (RD Assessment)  None  Hair  Reviewed  Eyes  Unable to assess  Mouth  Unable to assess  Skin  Reviewed  Nails  Reviewed     Diet Order:  DIET SOFT Room service appropriate? Yes; Fluid consistency: Thin  EDUCATION NEEDS:   Not appropriate for education at this time  Skin:  Skin Assessment: Skin Integrity Issues: Skin Integrity Issues:: Other (Comment) Other: MSAD to right buttocks; on HPI wound was documented as stage II  Last BM:  08/01/2017 - large type 7  Height:   Ht Readings from Last 1 Encounters:  07/30/17 5\' 7"  (1.702 m)    Weight:   Wt Readings from Last 1 Encounters:  08/01/17 173 lb 9.6 oz (78.7 kg)    Ideal Body Weight:  67.3 kg  BMI:  Body mass index is 27.19 kg/m.  Estimated Nutritional Needs:   Kcal:  1860-2015 (MSJ x 1.2-1.3)  Protein:  95-110 grams (1.2-1.4 grams/kg)  Fluid:  1.8-2 L/day (1 mL/kcal)  Willey Blade, MS, RD, LDN Office: [phone] Pager: [phone] After Hours/Weekend  Pager: [phone]

## 2017-08-02 NOTE — NC FL2 (Signed)
[place] LEVEL OF CARE SCREENING TOOL     IDENTIFICATION  Patient Name: Gregory Marquez Birthdate: 1955-10-22 Sex: male Admission Date (Current Location): 07/30/2017  [facility] and [place] Number:  Selena Lesser ([id number]) Facility and Address:  [facility], [address] [place],  [address]      Provider Number: [id number]  Attending Physician Name and Address:  Loletha Grayer, MD  Relative Name and Phone Number:       Current Level of Care: Hospital Recommended Level of Care: [place] Prior Approval Number:    Date Approved/Denied:   PASRR Number: [id number])  Discharge Plan: SNF    Current Diagnoses: Patient Active Problem List   Diagnosis Date Noted  . Acute hepatic encephalopathy 07/30/2017  . CAD (coronary artery disease) 07/20/2017  . Diabetes mellitus ([place]) 07/20/2017  . Essential hypertension 07/20/2017  . Mixed hyperlipidemia 07/20/2017  . Hepatic encephalopathy ([place]) 07/05/2017  . Decompensated hepatic cirrhosis ([place]) 06/16/2017  . Alcohol use disorder, severe, dependence ([place]) 06/15/2017  . Alcoholic cirrhosis of liver with ascites ([place]) 06/15/2017  . Macrocytic anemia 06/15/2017  . Encephalopathy, portal systemic ([place]) 06/15/2017    Orientation RESPIRATION BLADDER Height & Weight     Self  Normal Incontinent Weight: 173 lb 9.6 oz (78.7 kg) Height:  5\' 7"  (170.2 cm)  BEHAVIORAL SYMPTOMS/MOOD NEUROLOGICAL BOWEL NUTRITION STATUS      Incontinent Diet(Soft Diet. )  AMBULATORY STATUS COMMUNICATION OF NEEDS Skin   Extensive Assist Verbally Normal                       Personal Care Assistance Level of Assistance  Bathing, Feeding, Dressing Bathing Assistance: Limited assistance Feeding assistance: Independent Dressing Assistance: Limited assistance     Functional Limitations Info  Sight, Hearing, Speech Sight Info: Adequate Hearing Info: Adequate Speech Info: Adequate     SPECIAL CARE FACTORS FREQUENCY  PT (By licensed PT)     PT Frequency: (4-5)              Contractures      Additional Factors Info  Code Status, Allergies Code Status Info: (DNR ) Allergies Info: (Effexor Venlafaxine)           Current Medications (08/02/2017):  This is the current hospital active medication list Current Facility-Administered Medications  Medication Dose Route Frequency Provider Last Rate Last Dose  . acetaminophen (TYLENOL) tablet 650 mg  650 mg Oral Q6H PRN Salary, Montell D, MD       Or  . acetaminophen (TYLENOL) suppository 650 mg  650 mg Rectal Q6H PRN Salary, Montell D, MD      . albuterol (PROVENTIL) (2.5 MG/3ML) 0.083% nebulizer solution 2.5 mg  2.5 mg Inhalation Q4H PRN Salary, Montell D, MD      . aspirin EC tablet 81 mg  81 mg Oral Daily Salary, Montell D, MD   81 mg at 08/02/17 1206  . feeding supplement (ENSURE ENLIVE) (ENSURE ENLIVE) liquid 237 mL  237 mL Oral TID BM Wieting, Richard, MD      . folic acid (FOLVITE) tablet 1 mg  1 mg Oral Daily Salary, Montell D, MD   1 mg at 08/02/17 1206  . furosemide (LASIX) tablet 60 mg  60 mg Oral Daily Salary, Montell D, MD   60 mg at 08/02/17 1205  . haloperidol lactate (HALDOL) injection 1 mg  1 mg Intravenous Q6H PRN Loletha Grayer, MD   1 mg at 08/02/17 1223  .  HYDROcodone-acetaminophen (NORCO/VICODIN) 5-325 MG per tablet 1-2 tablet  1-2 tablet Oral Q4H PRN Salary, Montell D, MD      . lactulose (CHRONULAC) 10 GM/15ML solution 40 g  40 g Per Tube QID Wieting, Richard, MD      . lactulose [facility]) enema 200 gm  300 mL Rectal Once Salary, Montell D, MD      . mometasone-formoterol (DULERA) 100-5 MCG/ACT inhaler 2 puff  2 puff Inhalation BID Loney Hering D, MD   2 puff at 08/02/17 1205  . multivitamin with minerals tablet 1 tablet  1 tablet Oral Daily Salary, Montell D, MD   1 tablet at 08/02/17 1205  . ondansetron (ZOFRAN) tablet 4 mg  4 mg Oral Q6H PRN Salary, Montell D, MD       Or  .  ondansetron (ZOFRAN) injection 4 mg  4 mg Intravenous Q6H PRN Salary, Montell D, MD      . pantoprazole (PROTONIX) EC tablet 40 mg  40 mg Oral Daily Salary, Montell D, MD   40 mg at 08/02/17 1205  . QUEtiapine (SEROQUEL) tablet 25 mg  25 mg Oral QHS Wieting, Richard, MD      . rifaximin Doreene Nest) tablet 550 mg  550 mg Oral BID Loney Hering D, MD   550 mg at 08/02/17 1206  . sodium chloride flush (NS) 0.9 % injection 3 mL  3 mL Intravenous Q12H Salary, Montell D, MD   3 mL at 08/02/17 1210  . sodium chloride flush (NS) 0.9 % injection 3 mL  3 mL Intravenous PRN Salary, Montell D, MD      . spironolactone (ALDACTONE) tablet 100 mg  100 mg Oral Daily Salary, Montell D, MD   100 mg at 08/02/17 1205  . thiamine (VITAMIN B-1) tablet 100 mg  100 mg Oral Daily Salary, Montell D, MD   100 mg at 08/02/17 1206     Discharge Medications: Please see discharge summary for a list of discharge medications.  Relevant Imaging Results:  Relevant Lab Results:   Additional Information (SSN: [id number])  Julias Mould, Veronia Beets, LCSW

## 2017-08-03 DIAGNOSIS — R140 Abdominal distension (gaseous): Secondary | ICD-10-CM

## 2017-08-03 DIAGNOSIS — K7200 Acute and subacute hepatic failure without coma: Secondary | ICD-10-CM

## 2017-08-03 DIAGNOSIS — R1084 Generalized abdominal pain: Secondary | ICD-10-CM

## 2017-08-03 DIAGNOSIS — K7031 Alcoholic cirrhosis of liver with ascites: Secondary | ICD-10-CM

## 2017-08-03 DIAGNOSIS — Z1339 Encounter for screening examination for other mental health and behavioral disorders: Secondary | ICD-10-CM

## 2017-08-03 DIAGNOSIS — K7030 Alcoholic cirrhosis of liver without ascites: Secondary | ICD-10-CM

## 2017-08-03 LAB — AMMONIA: AMMONIA: 86 umol/L — AB (ref 9–35)

## 2017-08-03 LAB — COMPREHENSIVE METABOLIC PANEL
ALT: 39 U/L (ref 17–63)
ANION GAP: 8 (ref 5–15)
AST: 62 U/L — AB (ref 15–41)
Albumin: 2.1 g/dL — ABNORMAL LOW (ref 3.5–5.0)
Alkaline Phosphatase: 92 U/L (ref 38–126)
BILIRUBIN TOTAL: 3.7 mg/dL — AB (ref 0.3–1.2)
BUN: 15 mg/dL (ref 6–20)
CHLORIDE: 105 mmol/L (ref 101–111)
CO2: 22 mmol/L (ref 22–32)
Calcium: 8.1 mg/dL — ABNORMAL LOW (ref 8.9–10.3)
Creatinine, Ser: 0.67 mg/dL (ref 0.61–1.24)
Glucose, Bld: 132 mg/dL — ABNORMAL HIGH (ref 65–99)
POTASSIUM: 3.6 mmol/L (ref 3.5–5.1)
Sodium: 135 mmol/L (ref 135–145)
TOTAL PROTEIN: 5.9 g/dL — AB (ref 6.5–8.1)

## 2017-08-03 LAB — PROTEIN, BODY FLUID (OTHER): TOTAL PROTEIN, BODY FLUID OTHER: 0.5 g/dL

## 2017-08-03 MED ORDER — SULFAMETHOXAZOLE-TRIMETHOPRIM 800-160 MG PO TABS
1.0000 | ORAL_TABLET | Freq: Every day | ORAL | Status: DC
  Administered 2017-08-03 – 2017-08-04 (×2): 1 via ORAL
  Filled 2017-08-03 (×2): qty 1
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
## 2017-08-03 NOTE — Plan of Care (Signed)
Went in to see patient,  being cleaned from [facility] Went back to see patient and he was working with PT. Will reattempt as time allows.
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
## 2017-08-03 NOTE — Progress Notes (Signed)
Patient ID: Gregory Marquez, male   DOB: 1956-01-23, 61 y.o.   MRN: [id number]  [employer] PROGRESS NOTE  Gregory Marquez EZM:[id number] DOB: [birth year] DOA: 07/30/2017 PCP: [facility]  HPI/Subjective: Patient feeling a little bit better today.  When I saw him this morning he did not have a bowel movement.  He had 3 bowel movements yesterday.   Objective: Vitals:   08/03/17 0419 08/03/17 0543  BP: (!) 99/48 123/73  Pulse: (!) 103 (!) 107  Resp: 20   Temp: 98.4 F (36.9 C)   SpO2: 100% 99%    Filed Weights   07/31/17 0117 08/01/17 0500 08/03/17 0500  Weight: 76.8 kg (169 lb 4.8 oz) 78.7 kg (173 lb 9.6 oz) 76.2 kg (168 lb)    ROS: Review of Systems  Unable to perform ROS: Mental status change  Respiratory: Negative for shortness of breath.   Cardiovascular: Negative for chest pain.  Gastrointestinal: Negative for abdominal pain, nausea and vomiting.   Exam: Physical Exam  HENT:  Nose: No mucosal edema.  Mouth/Throat: No oropharyngeal exudate or posterior oropharyngeal edema.  Eyes: Conjunctivae, EOM and lids are normal. Pupils are equal, round, and reactive to light.  Neck: No JVD present. Carotid bruit is not present. No edema present. No thyroid mass and no thyromegaly present.  Cardiovascular: S1 normal and S2 normal. Exam reveals no gallop.  No murmur heard. Pulses:      Dorsalis pedis pulses are 2+ on the right side, and 2+ on the left side.  Respiratory: No respiratory distress. He has no wheezes. He has no rhonchi. He has no rales.  GI: Soft. Bowel sounds are normal. There is no tenderness.  Musculoskeletal:       Right ankle: He exhibits swelling.       Left ankle: He exhibits swelling.  Lymphadenopathy:    He has no cervical adenopathy.  Neurological: He is alert.  Patient moves his extremities on his own.  Skin: Skin is warm. No rash noted. Nails show no clubbing.  Psychiatric: He is actively hallucinating.      Data  Reviewed: Basic Metabolic Panel: Recent Labs  Lab 07/30/17 1934 08/01/17 0509 08/03/17 0423  NA 137 139 135  K 3.9 3.7 3.6  CL 109 112* 105  CO2 17* 19* 22  GLUCOSE 220* 138* 132*  BUN 18 19 15   CREATININE 1.05 0.79 0.67  CALCIUM 8.8* 8.4* 8.1*   Liver Function Tests: Recent Labs  Lab 07/30/17 1934 08/01/17 0509 08/03/17 0423  AST 82* 63* 62*  ALT 43 39 39  ALKPHOS 91 84 92  BILITOT 5.9* 4.7* 3.7*  PROT 7.0 6.1* 5.9*  ALBUMIN 2.5* 2.2* 2.1*   Recent Labs  Lab 07/30/17 1934  LIPASE 57*   Recent Labs  Lab 07/30/17 1934 07/31/17 0635 08/01/17 0509 08/03/17 0423  AMMONIA 127* 114* 95* 86*   CBC: Recent Labs  Lab 07/30/17 1934  WBC 4.2  NEUTROABS 3.2  HGB 10.8*  HCT 32.4*  MCV 107.2*  PLT 90*     Recent Results (from the past 240 hour(s))  Blood culture (routine x 2)     Status: None (Preliminary result)   Collection Time: 07/30/17  7:51 PM  Result Value Ref Range Status   Specimen Description BLOOD RIGHT ANTECUBITAL  Final   Special Requests   Final    BOTTLES DRAWN AEROBIC AND ANAEROBIC Blood Culture results may not be optimal due to an excessive volume of blood received in culture  bottles   Culture NO GROWTH 4 DAYS  Final   Report Status PENDING  Incomplete  MRSA PCR Screening     Status: None   Collection Time: 07/31/17  6:29 AM  Result Value Ref Range Status   MRSA by PCR NEGATIVE NEGATIVE Final    Comment:        The GeneXpert MRSA Assay (FDA approved for NASAL specimens only), is one component of a comprehensive MRSA colonization surveillance program. It is not intended to diagnose MRSA infection nor to guide or monitor treatment for MRSA infections.   Body fluid culture     Status: None (Preliminary result)   Collection Time: 08/02/17  9:30 AM  Result Value Ref Range Status   Specimen Description PERITONEAL  Final   Special Requests NONE  Final   Gram Stain NO WBC SEEN NO ORGANISMS SEEN   Final   Culture   Final    NO GROWTH <  24 HOURS Performed at [facility], [place] [address] [place], [place] [address]    Report Status PENDING  Incomplete     Studies: US Paracentesis  Result Date: 08/02/2017 INDICATION: 61 year old with acute hepatic encephalopathy. History of alcoholic cirrhosis of liver with ascites. EXAM: ULTRASOUND GUIDED PARACENTESIS MEDICATIONS: None. COMPLICATIONS: None immediate. PROCEDURE: Patient is confused and cannot gave informed consent. Informed consent was obtained from the patient's mother over the telephone. Initial ultrasound scanning demonstrates a small amount of ascites within the right upper quadrant. The right upper abdomen was prepped and draped in the usual sterile fashion. 1% lidocaine with was used for local anesthesia. Following this, a 19 gauge, 10-cm, Yueh catheter was introduced. An ultrasound image was saved for documentation purposes. The paracentesis was performed. The catheter was removed and a dressing was applied. The patient tolerated the procedure well without immediate post procedural complication. FINDINGS: A total of approximately 550 mL of yellow fluid was removed. Samples were sent to the laboratory as requested by the clinical team. IMPRESSION: Successful ultrasound-guided paracentesis yielding 550 mL of peritoneal fluid. Electronically Signed   By: Markus Daft M.D.   On: 08/02/2017 09:52    Scheduled Meds: . aspirin EC  81 mg Oral Daily  . feeding supplement (ENSURE ENLIVE)  237 mL Oral TID BM  . folic acid  1 mg Oral Daily  . furosemide  60 mg Oral Daily  . lactulose  40 g Per Tube QID  . lactulose  300 mL Rectal Once  . mometasone-formoterol  2 puff Inhalation BID  . multivitamin with minerals  1 tablet Oral Daily  . pantoprazole  40 mg Oral Daily  . QUEtiapine  25 mg Oral QHS  . rifaximin  550 mg Oral BID  . sodium chloride flush  3 mL Intravenous Q12H  . spironolactone  100 mg Oral Daily  . thiamine  100 mg Oral Daily    Assessment/Plan:  1. Acute  hepatic encephalopathy.  Continue Xifaxan.  Continue lactulose to 40 mg 4 times a day.  Patient needs to have 3 bowel movements a day at least.  Patient had 3 bowel movements yesterday.  Ammonia level still high at 86.  Prior to disposition last time his ammonia level was down to 33. 2. History of alcoholic cirrhosis, thrombocytopenia, auto anticoagulation and ascites. 3. Chronic dementia with mild cognitive impairment.  Patient had hallucinations yesterday.  He was a little more clear today.  As per family he was talking about the dead baby again today.  But  I did not get this from him. 4. Ascites on Spironolactone and Lasix.  Ultrasound paracentesis removed 550 mL.  White blood cell count not high enough to start antibiotic regimen but may be a candidate for antibiotics prophylactically. 5. Weakness.  Physical therapy evaluation appreciated 6. Stage II decubiti on right buttock 7. Palliative care consultation  Code Status:     Code Status Orders  (From admission, onward)        Start     Ordered   07/31/17 0116  Do not attempt resuscitation (DNR)  Continuous    Question Answer Comment  In the event of cardiac or respiratory ARREST Do not call a "code blue"   In the event of cardiac or respiratory ARREST Do not perform Intubation, CPR, defibrillation or ACLS   In the event of cardiac or respiratory ARREST Use medication by any route, position, wound care, and other measures to relive pain and suffering. May use oxygen, suction and manual treatment of airway obstruction as needed for comfort.      07/31/17 0115    Code Status History    Date Active Date Inactive Code Status Order ID Comments User Context   07/13/2017 18:44 07/14/2017 20:31 DNR [id number]  Gorden Harms, MD Inpatient   07/06/2017 02:45 07/08/2017 23:09 DNR [id number]  Salary, Avel Peace, MD Inpatient     Family Communication: Spoke with son on the phone Disposition Plan: Potentially back to facility with palliative  care versus hospice  Consultants:  Palliative care consultation  Time spent: 26 minutes  [place]
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
## 2017-08-03 NOTE — Progress Notes (Signed)
Patient ID: Gregory Marquez, male   DOB: 1956-05-27, 61 y.o.   MRN: [id number]  Acp discussion with son. Patient not able to paticipate.  Asked by palliative care team to speak with son again.  Patient has a Pugh score of 13 with a 55% 1 year mortality.  The only thing that helps a liver cirrhosis is a liver transplant.  Currently he is not a candidate.  Overall prognosis is poor.  I do recommend palliative care follow-up at the facility and conversion to hospice if and when the patient declines.  Also talked about potential comfort measures and not sending him back to the hospital if he does decline.  Time spent on acp discussion 17 minutes  Dr Loletha Grayer

## 2017-08-04 LAB — CYTOLOGY - NON PAP

## 2017-08-04 LAB — CULTURE, BLOOD (ROUTINE X 2): Culture: NO GROWTH

## 2017-08-04 LAB — PH, BODY FLUID: PH, BODY FLUID: 7.9

## 2017-08-04 LAB — AMMONIA: AMMONIA: 37 umol/L — AB (ref 9–35)

## 2017-08-04 MED ORDER — SULFAMETHOXAZOLE-TRIMETHOPRIM 800-160 MG PO TABS: 1.0000 | ORAL_TABLET | Freq: Every day | ORAL | 0 refills | Status: DC

## 2017-08-04 MED ORDER — ENSURE ENLIVE PO LIQD: 237.0000 mL | Freq: Three times a day (TID) | ORAL | 0 refills | Status: DC

## 2017-08-04 MED ORDER — QUETIAPINE FUMARATE 25 MG PO TABS: 25.0000 mg | ORAL_TABLET | Freq: Every day | ORAL | 0 refills | Status: DC

## 2017-08-04 MED ORDER — LACTULOSE 10 GM/15ML PO SOLN: 40.0000 g | Freq: Four times a day (QID) | ORAL | 0 refills | Status: DC

## 2017-08-04 NOTE — Progress Notes (Signed)
Patient is medically stable for D/C back to [facility] today. Per [employer] liaison patient can return today. RN will call report and arrange EMS for transport. Clinical Education officer, museum (CSW) sent D/C orders to [name] via [facility]. Patient is aware of above. CSW contacted patient's son Hilliard Clark and made him aware of above. Please reconsult if future social work needs arise. CSW signing off.   [employer], LCSW [phone]

## 2017-08-04 NOTE — Progress Notes (Signed)
Have been trying to give report to nurse at [facility] since [phone]. Called back and put on hold again. Will keep trying.  Patient is dressed and ready for discharge.
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
## 2017-08-04 NOTE — Plan of Care (Signed)
Discussed plan of care with pt. Pt continues to progress.

## 2017-08-04 NOTE — Progress Notes (Signed)
Report called to [place] at [name] [phone]). Patient is going to room 70A. EMS called.

## 2017-08-04 NOTE — Progress Notes (Signed)
Checked with EMS about transport. Per receptionist, patient is next on the list.

## 2017-08-04 NOTE — Progress Notes (Signed)
Patient ID: Gregory Marquez, male   DOB: 16-Jul-1956, 61 y.o.   MRN: [id number]  Patient was seen by myself, Dr. Loletha Grayer and psychiatrist Dr. Alethia Berthold.  The patient has severe cirrhosis and hepatic encephalopathy.  He has very impaired short-term memory.  He is not able to articulate a rational plan for caring for his self and his illness.  He does not have a clear understanding of his medical condition.  He is unable to make informed or thoughtful decisions about his care.  Obtaining guardianship is appropriate for this patient's situation.   Dr. Loletha Grayer

## 2017-08-07 LAB — BODY FLUID CULTURE: Gram Stain: NONE SEEN

## 2017-08-13 ENCOUNTER — Inpatient Hospital Stay
Admission: EM | Admit: 2017-08-13 | Discharge: 2017-08-16 | DRG: 442 | Disposition: A | Discharge status: Skilled Nursing Facility | Payer: Medicaid Other | Attending: Specialist | Admitting: Specialist

## 2017-08-13 ENCOUNTER — Other Ambulatory Visit: Payer: Self-pay

## 2017-08-13 DIAGNOSIS — M109 Gout, unspecified: Secondary | ICD-10-CM | POA: Diagnosis present

## 2017-08-13 DIAGNOSIS — Z87891 Personal history of nicotine dependence: Secondary | ICD-10-CM

## 2017-08-13 DIAGNOSIS — Z811 Family history of alcohol abuse and dependence: Secondary | ICD-10-CM | POA: Diagnosis not present

## 2017-08-13 DIAGNOSIS — Z85828 Personal history of other malignant neoplasm of skin: Secondary | ICD-10-CM | POA: Diagnosis not present

## 2017-08-13 DIAGNOSIS — F1010 Alcohol abuse, uncomplicated: Secondary | ICD-10-CM | POA: Diagnosis present

## 2017-08-13 DIAGNOSIS — D6959 Other secondary thrombocytopenia: Secondary | ICD-10-CM | POA: Diagnosis present

## 2017-08-13 DIAGNOSIS — Z7951 Long term (current) use of inhaled steroids: Secondary | ICD-10-CM

## 2017-08-13 DIAGNOSIS — N529 Male erectile dysfunction, unspecified: Secondary | ICD-10-CM | POA: Diagnosis present

## 2017-08-13 DIAGNOSIS — R4182 Altered mental status, unspecified: Secondary | ICD-10-CM | POA: Diagnosis not present

## 2017-08-13 DIAGNOSIS — F05 Delirium due to known physiological condition: Secondary | ICD-10-CM | POA: Diagnosis present

## 2017-08-13 DIAGNOSIS — M1990 Unspecified osteoarthritis, unspecified site: Secondary | ICD-10-CM | POA: Diagnosis present

## 2017-08-13 DIAGNOSIS — E559 Vitamin D deficiency, unspecified: Secondary | ICD-10-CM | POA: Diagnosis present

## 2017-08-13 DIAGNOSIS — Z888 Allergy status to other drugs, medicaments and biological substances status: Secondary | ICD-10-CM | POA: Diagnosis not present

## 2017-08-13 DIAGNOSIS — E1140 Type 2 diabetes mellitus with diabetic neuropathy, unspecified: Secondary | ICD-10-CM | POA: Diagnosis present

## 2017-08-13 DIAGNOSIS — Z8249 Family history of ischemic heart disease and other diseases of the circulatory system: Secondary | ICD-10-CM

## 2017-08-13 DIAGNOSIS — K219 Gastro-esophageal reflux disease without esophagitis: Secondary | ICD-10-CM | POA: Diagnosis present

## 2017-08-13 DIAGNOSIS — F419 Anxiety disorder, unspecified: Secondary | ICD-10-CM | POA: Diagnosis present

## 2017-08-13 DIAGNOSIS — Z85038 Personal history of other malignant neoplasm of large intestine: Secondary | ICD-10-CM

## 2017-08-13 DIAGNOSIS — J449 Chronic obstructive pulmonary disease, unspecified: Secondary | ICD-10-CM | POA: Diagnosis present

## 2017-08-13 DIAGNOSIS — D539 Nutritional anemia, unspecified: Secondary | ICD-10-CM | POA: Diagnosis present

## 2017-08-13 DIAGNOSIS — K7682 Hepatic encephalopathy: Secondary | ICD-10-CM

## 2017-08-13 DIAGNOSIS — Z8261 Family history of arthritis: Secondary | ICD-10-CM

## 2017-08-13 DIAGNOSIS — Z7982 Long term (current) use of aspirin: Secondary | ICD-10-CM

## 2017-08-13 DIAGNOSIS — K7290 Hepatic failure, unspecified without coma: Secondary | ICD-10-CM | POA: Diagnosis not present

## 2017-08-13 DIAGNOSIS — R7989 Other specified abnormal findings of blood chemistry: Secondary | ICD-10-CM

## 2017-08-13 DIAGNOSIS — I10 Essential (primary) hypertension: Secondary | ICD-10-CM | POA: Diagnosis present

## 2017-08-13 DIAGNOSIS — E785 Hyperlipidemia, unspecified: Secondary | ICD-10-CM | POA: Diagnosis present

## 2017-08-13 DIAGNOSIS — R410 Disorientation, unspecified: Secondary | ICD-10-CM

## 2017-08-13 DIAGNOSIS — R17 Unspecified jaundice: Secondary | ICD-10-CM

## 2017-08-13 DIAGNOSIS — K7031 Alcoholic cirrhosis of liver with ascites: Secondary | ICD-10-CM | POA: Diagnosis present

## 2017-08-13 DIAGNOSIS — Z66 Do not resuscitate: Secondary | ICD-10-CM | POA: Diagnosis present

## 2017-08-13 DIAGNOSIS — Z79899 Other long term (current) drug therapy: Secondary | ICD-10-CM

## 2017-08-13 LAB — COMPREHENSIVE METABOLIC PANEL
ALBUMIN: 2.4 g/dL — AB (ref 3.5–5.0)
ALT: 48 U/L (ref 17–63)
AST: 96 U/L — AB (ref 15–41)
Alkaline Phosphatase: 102 U/L (ref 38–126)
Anion gap: 10 (ref 5–15)
BILIRUBIN TOTAL: 4.8 mg/dL — AB (ref 0.3–1.2)
BUN: 13 mg/dL (ref 6–20)
CHLORIDE: 105 mmol/L (ref 101–111)
CO2: 18 mmol/L — ABNORMAL LOW (ref 22–32)
Calcium: 8.5 mg/dL — ABNORMAL LOW (ref 8.9–10.3)
Creatinine, Ser: 0.8 mg/dL (ref 0.61–1.24)
GFR calc Af Amer: >60 mL/min (ref >60)
GFR calc non Af Amer: >60 mL/min (ref >60)
GLUCOSE: 170 mg/dL — AB (ref 65–99)
POTASSIUM: 3.8 mmol/L (ref 3.5–5.1)
Sodium: 133 mmol/L — ABNORMAL LOW (ref 135–145)
Total Protein: 6.4 g/dL — ABNORMAL LOW (ref 6.5–8.1)

## 2017-08-13 LAB — AMMONIA
AMMONIA: 99 umol/L — AB (ref 9–35)
AMMONIA: 55 umol/L — AB (ref 9–35)

## 2017-08-13 LAB — CBC WITH DIFFERENTIAL/PLATELET
BASOS ABS: 0 10*3/uL (ref 0–0.1)
Basophils Relative: 0 %
EOS PCT: 1 %
Eosinophils Absolute: 0.1 10*3/uL (ref 0–0.7)
HCT: 29.5 % — ABNORMAL LOW (ref 40.0–52.0)
HEMOGLOBIN: 10.2 g/dL — AB (ref 13.0–18.0)
LYMPHS PCT: 5 %
Lymphs Abs: 0.4 10*3/uL — ABNORMAL LOW (ref 1.0–3.6)
MCH: 36.6 pg — ABNORMAL HIGH (ref 26.0–34.0)
MCHC: 34.7 g/dL (ref 32.0–36.0)
MCV: 105.5 fL — AB (ref 80.0–100.0)
Monocytes Absolute: 0.9 10*3/uL (ref 0.2–1.0)
Monocytes Relative: 12 %
NEUTROS PCT: 82 %
Neutro Abs: 6.3 10*3/uL (ref 1.4–6.5)
PLATELETS: 128 10*3/uL — AB (ref 150–440)
RBC: 2.8 MIL/uL — AB (ref 4.40–5.90)
RDW: 17.1 % — ABNORMAL HIGH (ref 11.5–14.5)
WBC: 7.7 10*3/uL (ref 3.8–10.6)

## 2017-08-13 LAB — GLUCOSE, CAPILLARY
GLUCOSE-CAPILLARY: 108 mg/dL — AB (ref 65–99)
Glucose-Capillary: 121 mg/dL — ABNORMAL HIGH (ref 65–99)

## 2017-08-13 MED ORDER — ASPIRIN 300 MG RE SUPP
300.0000 mg | Freq: Every day | RECTAL | Status: DC
  Administered 2017-08-13: 18:00:00 300 mg via RECTAL
  Filled 2017-08-13 (×2): qty 1

## 2017-08-13 MED ORDER — ONDANSETRON HCL 4 MG/2ML IJ SOLN: 4.0000 mg | Freq: Four times a day (QID) | INTRAMUSCULAR | Status: DC | PRN

## 2017-08-13 MED ORDER — INSULIN ASPART 100 UNIT/ML ~~LOC~~ SOLN
0.0000 [IU] | Freq: Three times a day (TID) | SUBCUTANEOUS | Status: DC
  Administered 2017-08-15: 12:00:00 2 [IU] via SUBCUTANEOUS
  Administered 2017-08-16: 1 [IU] via SUBCUTANEOUS
  Filled 2017-08-13 (×2): qty 1

## 2017-08-13 MED ORDER — ALBUTEROL SULFATE (2.5 MG/3ML) 0.083% IN NEBU: 2.5000 mg | INHALATION_SOLUTION | RESPIRATORY_TRACT | Status: DC | PRN

## 2017-08-13 MED ORDER — MOMETASONE FURO-FORMOTEROL FUM 100-5 MCG/ACT IN AERO
2.0000 | INHALATION_SPRAY | Freq: Two times a day (BID) | RESPIRATORY_TRACT | Status: DC
  Administered 2017-08-13 – 2017-08-16 (×6): 2 via RESPIRATORY_TRACT
  Filled 2017-08-13: qty 8.8

## 2017-08-13 MED ORDER — ENOXAPARIN SODIUM 40 MG/0.4ML ~~LOC~~ SOLN
40.0000 mg | SUBCUTANEOUS | Status: DC
  Administered 2017-08-13 – 2017-08-15 (×3): 40 mg via SUBCUTANEOUS
  Filled 2017-08-13 (×3): qty 0.4

## 2017-08-13 MED ORDER — KETOCONAZOLE 2 % EX CREA: 1.0000 "application " | TOPICAL_CREAM | Freq: Two times a day (BID) | CUTANEOUS | Status: DC

## 2017-08-13 MED ORDER — HYDROCORTISONE 2.5 % EX LOTN: 1.0000 "application " | TOPICAL_LOTION | Freq: Two times a day (BID) | CUTANEOUS | Status: DC

## 2017-08-13 MED ORDER — INSULIN ASPART 100 UNIT/ML ~~LOC~~ SOLN: 0.0000 [IU] | Freq: Every day | SUBCUTANEOUS | Status: DC

## 2017-08-13 MED ORDER — LACTULOSE 10 GM/15ML PO SOLN: 30.0000 g | Freq: Once | ORAL | Status: DC

## 2017-08-13 MED ORDER — ALBUTEROL SULFATE HFA 108 (90 BASE) MCG/ACT IN AERS: 2.0000 | INHALATION_SPRAY | RESPIRATORY_TRACT | Status: DC | PRN

## 2017-08-13 MED ORDER — LACTULOSE ENEMA
300.0000 mL | Freq: Four times a day (QID) | ORAL | Status: DC
  Administered 2017-08-13 – 2017-08-15 (×6): 300 mL via RECTAL
  Filled 2017-08-13 (×7): qty 300

## 2017-08-13 MED ORDER — LACTULOSE ENEMA
300.0000 mL | Freq: Once | ORAL | Status: AC
  Administered 2017-08-13: 300 mL via RECTAL
  Filled 2017-08-13: qty 300

## 2017-08-13 MED ORDER — ONDANSETRON HCL 4 MG PO TABS: 4.0000 mg | ORAL_TABLET | Freq: Four times a day (QID) | ORAL | Status: DC | PRN

## 2017-08-13 NOTE — ED Notes (Signed)
ED provider notified lactulose not sent by pharmacy, patient going to floor and floor will be notified of need to give lactulose enema, pharmacy will be notified to send lactulose enema to floor
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
## 2017-08-13 NOTE — ED Notes (Signed)
Patient has HX of liver failure, appears slightly jaundice and has distended abdomen, denies pain, PAINAD scale yields pain score of 0/10

## 2017-08-13 NOTE — ED Notes (Signed)
Called report to [facility] [facility] [employer] RN

## 2017-08-13 NOTE — ED Triage Notes (Addendum)
Patient has hx of liver failure, abdominal pain reported by facility but denied by patient, abdomen distended. Patient has Hx dementia, denies reports pain but can not verbalize the score. Pain score via PAINAD 0/10

## 2017-08-13 NOTE — ED Notes (Signed)
Pharmacy notified by message and by phone to send lactulose enema to floor for room 111 where patient is admitted.
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
## 2017-08-13 NOTE — ED Notes (Signed)
Dedra called back for report

## 2017-08-14 LAB — COMPREHENSIVE METABOLIC PANEL
ALBUMIN: 2.4 g/dL — AB (ref 3.5–5.0)
ALT: 51 U/L (ref 17–63)
AST: 91 U/L — AB (ref 15–41)
Alkaline Phosphatase: 104 U/L (ref 38–126)
Anion gap: 7 (ref 5–15)
BUN: 13 mg/dL (ref 6–20)
CHLORIDE: 107 mmol/L (ref 101–111)
CO2: 20 mmol/L — AB (ref 22–32)
Calcium: 8.4 mg/dL — ABNORMAL LOW (ref 8.9–10.3)
Creatinine, Ser: 0.93 mg/dL (ref 0.61–1.24)
GFR calc Af Amer: >60 mL/min (ref >60)
GFR calc non Af Amer: >60 mL/min (ref >60)
GLUCOSE: 118 mg/dL — AB (ref 65–99)
POTASSIUM: 3.7 mmol/L (ref 3.5–5.1)
Sodium: 134 mmol/L — ABNORMAL LOW (ref 135–145)
Total Bilirubin: 4.6 mg/dL — ABNORMAL HIGH (ref 0.3–1.2)
Total Protein: 6.3 g/dL — ABNORMAL LOW (ref 6.5–8.1)

## 2017-08-14 LAB — CBC
HEMATOCRIT: 29.2 % — AB (ref 40.0–52.0)
Hemoglobin: 9.9 g/dL — ABNORMAL LOW (ref 13.0–18.0)
MCH: 35.6 pg — ABNORMAL HIGH (ref 26.0–34.0)
MCHC: 33.9 g/dL (ref 32.0–36.0)
MCV: 105 fL — ABNORMAL HIGH (ref 80.0–100.0)
PLATELETS: 101 10*3/uL — AB (ref 150–440)
RBC: 2.78 MIL/uL — ABNORMAL LOW (ref 4.40–5.90)
RDW: 17 % — AB (ref 11.5–14.5)
WBC: 8.6 10*3/uL (ref 3.8–10.6)

## 2017-08-14 LAB — GLUCOSE, CAPILLARY
GLUCOSE-CAPILLARY: 100 mg/dL — AB (ref 65–99)
GLUCOSE-CAPILLARY: 139 mg/dL — AB (ref 65–99)
Glucose-Capillary: 116 mg/dL — ABNORMAL HIGH (ref 65–99)
Glucose-Capillary: 107 mg/dL — ABNORMAL HIGH (ref 65–99)

## 2017-08-14 LAB — AMMONIA: Ammonia: 41 umol/L — ABNORMAL HIGH (ref 9–35)

## 2017-08-14 MED ORDER — ACETAMINOPHEN 80 MG RE SUPP
80.0000 mg | Freq: Once | RECTAL | Status: DC
  Filled 2017-08-14: qty 1

## 2017-08-14 MED ORDER — ORAL CARE MOUTH RINSE
15.0000 mL | Freq: Two times a day (BID) | OROMUCOSAL | Status: DC
  Administered 2017-08-14 – 2017-08-15 (×4): 15 mL via OROMUCOSAL

## 2017-08-14 MED ORDER — SODIUM CHLORIDE 0.9% FLUSH
3.0000 mL | INTRAVENOUS | Status: DC | PRN
  Administered 2017-08-15: 3 mL via INTRAVENOUS
  Filled 2017-08-14: qty 3

## 2017-08-14 MED ORDER — SODIUM CHLORIDE 0.9% FLUSH
3.0000 mL | Freq: Two times a day (BID) | INTRAVENOUS | Status: DC
  Administered 2017-08-14 – 2017-08-16 (×5): 3 mL via INTRAVENOUS

## 2017-08-14 MED ORDER — ACETAMINOPHEN 650 MG RE SUPP
325.0000 mg | Freq: Once | RECTAL | Status: AC
  Administered 2017-08-14: 325 mg via RECTAL
  Filled 2017-08-14: qty 1

## 2017-08-14 NOTE — Progress Notes (Signed)
[place] at [place] NAME: Gregory Marquez    MR#:  [id number]  DATE OF BIRTH:  December 05, 1955  SUBJECTIVE:   Patient here due to altered mental status and noted to have hepatic encephalopathy. Patient is receiving lactulose enemas and ammonia level has come down but still remains lethargic/encephalopathic.  REVIEW OF SYSTEMS:    Review of Systems  Unable to perform ROS: Mental acuity    Nutrition: NPO Tolerating Diet: No Tolerating PT: Await Eval.   DRUG ALLERGIES:   Allergies  Allergen Reactions  . Effexor [Venlafaxine]     VITALS:  Blood pressure 140/76, pulse 98, temperature 98.4 F (36.9 C), temperature source Oral, resp. rate 20, height 5\' 6"  (1.676 m), weight 72.8 kg (160 lb 8 oz), SpO2 100 %.  PHYSICAL EXAMINATION:   Physical Exam  GENERAL:  61 y.o.-year-old patient lying in bed Lethargic, Encephalopathic.  EYES: Pupils equal, round, reactive to light. + scleral icterus. Extraocular muscles intact.  HEENT: Head atraumatic, normocephalic. Oropharynx and nasopharynx clear.  Dry Oral Mucosa NECK:  Supple, no jugular venous distention. No thyroid enlargement, no tenderness.  LUNGS: Poor Resp. effort, no wheezing, rales, rhonchi. No use of accessory muscles of respiration.  CARDIOVASCULAR: S1, S2 normal. No murmurs, rubs, or gallops.  ABDOMEN: Soft, nontender, nondistended. Bowel sounds present. No organomegaly or mass.  EXTREMITIES: No cyanosis, clubbing or edema b/l.    NEUROLOGIC: Cranial nerves II through XII are intact. No focal Motor or sensory deficits b/l. Encephalopathic.   PSYCHIATRIC: Encephalopathic.  SKIN: No obvious rash, lesion, or ulcer.    LABORATORY PANEL:   CBC Recent Labs  Lab 08/14/17 0618  WBC 8.6  HGB 9.9*  HCT 29.2*  PLT 101*   ------------------------------------------------------------------------------------------------------------------  Chemistries  Recent Labs  Lab 08/14/17 0618  NA  134*  K 3.7  CL 107  CO2 20*  GLUCOSE 118*  BUN 13  CREATININE 0.93  CALCIUM 8.4*  AST 91*  ALT 51  ALKPHOS 104  BILITOT 4.6*   ------------------------------------------------------------------------------------------------------------------  Cardiac Enzymes No results for input(s): TROPONINI in the last 168 hours. ------------------------------------------------------------------------------------------------------------------  RADIOLOGY:  No results found.   ASSESSMENT AND PLAN:   61 year old male with past medical history of chronic liver disease, liver cirrhosis, history of colon cancer, COPD, diabetes, GERD, hyperlipidemia, neuropathy, alcohol abuse who presents to the hospital due to altered mental status and noted to be encephalopathic.  1. Altered mental status-secondary to hepatic encephalopathy. -Patient's ammonia level was as high as 99 and down to 41 today. Mental status still not back to baseline. -Continue lactulose enemas, continue follow mental status.  2. Hepatic encephalopathy-this is the cause of patient's altered mental status. Ammonia level has trended down but clinically he has not improved. -Continue lactulose enemas until his mental status improves. No evidence of acute infectious source or GI bleeding presently.  3. Diabetes type 2 without complication-continue sliding scale insulin, follow blood sugars.  4. COPD-no acute exacerbation-continue Dulera, albuterol nebulizers as needed..  All the records are reviewed and case discussed with Care Management/Social Worker. Management plans discussed with the patient, family and they are in agreement.  CODE STATUS: DO NOT RESUSCITATE  DVT Prophylaxis: Lovenox  TOTAL TIME TAKING CARE OF THIS PATIENT: 30 minutes.   POSSIBLE D/C IN 1-2 DAYS, DEPENDING ON CLINICAL CONDITION.   Henreitta Leber M.D on 08/14/2017 at 12:18 PM  Between 7am to 6pm - Pager - [phone]  After 6pm go to [email or web address] -  [place]  [facility]  Office  [phone]  CC: Primary care physician; Center, [facility]

## 2017-08-14 NOTE — Plan of Care (Addendum)
Pt given lactulose enema this morning and this p.m. with blood streaks in return with MD awarea; aspirin discontinued. Ammonia down to 44. Remains confused/alert/interactive with care. Family has visited and called on phone. Remains jaundiced with abdominal distension. Remains NPO with mouthcare; MD paged in regards to " IVF's.

## 2017-08-15 LAB — COMPREHENSIVE METABOLIC PANEL
ALT: 61 U/L (ref 17–63)
ANION GAP: 9 (ref 5–15)
AST: 123 U/L — ABNORMAL HIGH (ref 15–41)
Albumin: 2.4 g/dL — ABNORMAL LOW (ref 3.5–5.0)
Alkaline Phosphatase: 110 U/L (ref 38–126)
BILIRUBIN TOTAL: 4.6 mg/dL — AB (ref 0.3–1.2)
BUN: 13 mg/dL (ref 6–20)
CO2: 18 mmol/L — ABNORMAL LOW (ref 22–32)
Calcium: 8.7 mg/dL — ABNORMAL LOW (ref 8.9–10.3)
Chloride: 106 mmol/L (ref 101–111)
Creatinine, Ser: 0.73 mg/dL (ref 0.61–1.24)
GFR calc Af Amer: >60 mL/min (ref >60)
Glucose, Bld: 117 mg/dL — ABNORMAL HIGH (ref 65–99)
POTASSIUM: 3.6 mmol/L (ref 3.5–5.1)
Sodium: 133 mmol/L — ABNORMAL LOW (ref 135–145)
TOTAL PROTEIN: 6.7 g/dL (ref 6.5–8.1)

## 2017-08-15 LAB — GLUCOSE, CAPILLARY
GLUCOSE-CAPILLARY: 144 mg/dL — AB (ref 65–99)
Glucose-Capillary: 172 mg/dL — ABNORMAL HIGH (ref 65–99)
Glucose-Capillary: 94 mg/dL (ref 65–99)

## 2017-08-15 LAB — AMMONIA: Ammonia: 40 umol/L — ABNORMAL HIGH (ref 9–35)

## 2017-08-15 MED ORDER — RIFAXIMIN 550 MG PO TABS
550.0000 mg | ORAL_TABLET | Freq: Two times a day (BID) | ORAL | Status: DC
  Administered 2017-08-15 – 2017-08-16 (×3): 550 mg via ORAL
  Filled 2017-08-15 (×3): qty 1

## 2017-08-15 MED ORDER — QUETIAPINE FUMARATE 25 MG PO TABS
25.0000 mg | ORAL_TABLET | Freq: Every day | ORAL | Status: DC
  Administered 2017-08-15: 25 mg via ORAL
  Filled 2017-08-15: qty 1

## 2017-08-15 MED ORDER — LACTULOSE 10 GM/15ML PO SOLN: 40.0000 g | Freq: Four times a day (QID) | ORAL | Status: DC

## 2017-08-15 MED ORDER — FOLIC ACID 1 MG PO TABS
1.0000 mg | ORAL_TABLET | Freq: Every day | ORAL | Status: DC
  Administered 2017-08-15 – 2017-08-16 (×2): 1 mg via ORAL
  Filled 2017-08-15 (×2): qty 1

## 2017-08-15 MED ORDER — HALOPERIDOL LACTATE 5 MG/ML IJ SOLN
2.5000 mg | Freq: Four times a day (QID) | INTRAMUSCULAR | Status: DC | PRN
  Administered 2017-08-15: 2.5 mg via INTRAVENOUS
  Filled 2017-08-15: qty 1

## 2017-08-15 MED ORDER — LACTULOSE 10 GM/15ML PO SOLN
30.0000 g | Freq: Three times a day (TID) | ORAL | Status: DC
  Administered 2017-08-15 – 2017-08-16 (×4): 30 g via ORAL
  Filled 2017-08-15 (×4): qty 60

## 2017-08-15 MED ORDER — PANTOPRAZOLE SODIUM 40 MG PO TBEC
40.0000 mg | DELAYED_RELEASE_TABLET | Freq: Every day | ORAL | Status: DC
  Administered 2017-08-15 – 2017-08-16 (×2): 40 mg via ORAL
  Filled 2017-08-15 (×2): qty 1

## 2017-08-15 NOTE — NC FL2 (Signed)
[place] LEVEL OF CARE SCREENING TOOL     IDENTIFICATION  Patient Name: Gregory Marquez Birthdate: 07-04-1956 Sex: male Admission Date (Current Location): 08/13/2017  [place] and [place] Number:      Facility and Address:  [facility], [address], [place], [place] [address]      Provider Number: [id number]  Attending Physician Name and Address:  Henreitta Leber, MD  Relative Name and Phone Number:       Current Level of Care: Hospital Recommended Level of Care: [place] Prior Approval Number:    Date Approved/Denied:   PASRR Number: [id number]  Discharge Plan: SNF    Current Diagnoses: Patient Active Problem List   Diagnosis Date Noted  . Acute hepatic encephalopathy 07/30/2017  . CAD (coronary artery disease) 07/20/2017  . Diabetes mellitus ([place]) 07/20/2017  . Essential hypertension 07/20/2017  . Mixed hyperlipidemia 07/20/2017  . Hepatic encephalopathy ([place]) 07/05/2017  . Decompensated hepatic cirrhosis ([place]) 06/16/2017  . Alcohol use disorder, severe, dependence ([place]) 06/15/2017  . Alcoholic cirrhosis of liver with ascites ([place]) 06/15/2017  . Macrocytic anemia 06/15/2017  . Encephalopathy, portal systemic ([place]) 06/15/2017    Orientation RESPIRATION BLADDER Height & Weight     Self, Place  Normal Incontinent Weight: 160 lb 8 oz (72.8 kg) Height:  5\' 6"  (167.6 cm)  BEHAVIORAL SYMPTOMS/MOOD NEUROLOGICAL BOWEL NUTRITION STATUS      Incontinent Diet(Heart healthy/Carb modified)  AMBULATORY STATUS COMMUNICATION OF NEEDS Skin   Extensive Assist Verbally Normal                       Personal Care Assistance Level of Assistance  Bathing, Feeding, Dressing Bathing Assistance: Limited assistance Feeding assistance: Independent Dressing Assistance: Limited assistance     Functional Limitations Info    Sight Info: Adequate Hearing Info: Adequate Speech Info: Adequate    SPECIAL CARE  FACTORS FREQUENCY                       Contractures Contractures Info: Not present    Additional Factors Info  Code Status, Psychotropic, Insulin Sliding Scale, Allergies Code Status Info: DNR Allergies Info: Effexor Psychotropic Info: Seroquel Insulin Sliding Scale Info: Novolog: 0-5 units QHS and 0-9 units TID with meals       Current Medications (08/15/2017):  This is the current hospital active medication list Current Facility-Administered Medications  Medication Dose Route Frequency Provider Last Rate Last Dose  . albuterol (PROVENTIL) (2.5 MG/3ML) 0.083% nebulizer solution 2.5 mg  2.5 mg Nebulization Q4H PRN Gouru, Aruna, MD      . enoxaparin (LOVENOX) injection 40 mg  40 mg Subcutaneous Q24H Gouru, Aruna, MD   40 mg at 08/14/17 2207  . folic acid (FOLVITE) tablet 1 mg  1 mg Oral Daily Henreitta Leber, MD   1 mg at 08/15/17 1212  . haloperidol lactate (HALDOL) injection 2.5 mg  2.5 mg Intravenous Q6H PRN Henreitta Leber, MD   2.5 mg at 08/15/17 1304  . insulin aspart (novoLOG) injection 0-5 Units  0-5 Units Subcutaneous QHS Gouru, Aruna, MD      . insulin aspart (novoLOG) injection 0-9 Units  0-9 Units Subcutaneous TID WC Nicholes Mango, MD   2 Units at 08/15/17 1211  . lactulose (CHRONULAC) 10 GM/15ML solution 30 g  30 g Oral TID Henreitta Leber, MD   30 g at 08/15/17 0906  . MEDLINE mouth rinse  15 mL Mouth  Rinse BID Henreitta Leber, MD   15 mL at 08/15/17 4462  . mometasone-formoterol (DULERA) 100-5 MCG/ACT inhaler 2 puff  2 puff Inhalation BID Nicholes Mango, MD   2 puff at 08/15/17 0806  . ondansetron (ZOFRAN) tablet 4 mg  4 mg Oral Q6H PRN Gouru, Aruna, MD       Or  . ondansetron (ZOFRAN) injection 4 mg  4 mg Intravenous Q6H PRN Gouru, Aruna, MD      . pantoprazole (PROTONIX) EC tablet 40 mg  40 mg Oral Daily Henreitta Leber, MD   40 mg at 08/15/17 1212  . QUEtiapine (SEROQUEL) tablet 25 mg  25 mg Oral QHS Sainani, Belia Heman, MD      . rifaximin Doreene Nest) tablet 550  mg  550 mg Oral BID Henreitta Leber, MD   550 mg at 08/15/17 0906  . sodium chloride flush (NS) 0.9 % injection 3 mL  3 mL Intravenous Q12H Henreitta Leber, MD   3 mL at 08/15/17 0807  . sodium chloride flush (NS) 0.9 % injection 3 mL  3 mL Intravenous PRN Henreitta Leber, MD   3 mL at 08/15/17 1305     Discharge Medications: Please see discharge summary for a list of discharge medications.  Relevant Imaging Results:  Relevant Lab Results:   Additional Information SS# [id number]  Zettie Pho, LCSW

## 2017-08-15 NOTE — Progress Notes (Signed)
Pt getting OOB activating alarm to :" use the bathroom" with pt ambulated to BR with slightly unsteady gait; returned to bed. Pt activated alarm again and found OOB by staff with pt becoming agitated; threatened to become combative refusing to go back to bed/room. Ambulated with walker in hall with SB+ stating he is "going to leave"; RN called son with son stating he is on his way in. MD paged and updated. Pt assisted back to room to use BR again, son here, MD in to assess pt with new orders.

## 2017-08-15 NOTE — Clinical Social Work Note (Signed)
Clinical Social Work Assessment  Patient Details  Name: Gregory Marquez MRN: [id number] Date of Birth: [birth year]  Date of referral:  08/15/17               Reason for consult:  Facility Placement                Permission sought to share information with:  Facility Art therapist granted to share information::  Yes, Verbal Permission Granted  Name::        Agency::     Relationship::     Contact Information:     Housing/Transportation Living arrangements for the past 2 months:  [place] of Information:  Medical Team, Adult Children, Facility Patient Interpreter Needed:  None Criminal Activity/Legal Involvement Pertinent to Current Situation/Hospitalization:  No - Comment as needed Significant Relationships:  Adult Children, Community Support Lives with:  Facility Resident Do you feel safe going back to the place where you live?  Yes Need for family participation in patient care:  Yes (Comment)  Care giving concerns:  Patient admitted from [facility])   Social Worker assessment / plan: Patient admitted from [facility] where he is a [place] resident. The patient will return once stable according to the facility. CSW is following to facilitate discharge.  Employment status:  Retired Forensic scientist:  Medicaid In [place] PT Recommendations:  Not assessed at this time Information / Referral to community resources:     Patient/Family's Response to care:  Patient was reactive and no family available. Patient/Family's Understanding of and Emotional Response to Diagnosis, Current Treatment, and Prognosis:  The patient is a LTC resident and will return to his facility when stable.  Emotional Assessment Appearance:  Appears stated age Attitude/Demeanor/Rapport:  Reactive Affect (typically observed):  Restless Orientation:  Oriented to Self Alcohol / Substance use:  Never Used Psych  involvement (Current and /or in the community):  No (Comment)  Discharge Needs  Concerns to be addressed:  Care Coordination, Discharge Planning Concerns Readmission within the last 30 days:  Yes Current discharge risk:  Chronically ill Barriers to Discharge:  Continued Medical Work up   [employer], LCSW 08/15/2017, 3:20 PM

## 2017-08-15 NOTE — Progress Notes (Signed)
[place] at [place] NAME: Gregory Marquez    MR#:  [id number]  DATE OF BIRTH:  04-09-1956  SUBJECTIVE:   Ammonia level stable, mental status much improved since yesterday but patient continues to have periods of agitation. Patient's son is at bedside. As per the son patient does get agitated when she comes out of his hepatic encephalopathy.  REVIEW OF SYSTEMS:    Review of Systems  Unable to perform ROS: Mental acuity    Nutrition: Clear liquid and will advance as tolerated Tolerating Diet: Yes Tolerating PT: Ambulatory  DRUG ALLERGIES:   Allergies  Allergen Reactions  . Effexor [Venlafaxine]     VITALS:  Blood pressure (!) 158/80, pulse 100, temperature 98 F (36.7 C), temperature source Oral, resp. rate (!) 24, height 5\' 6"  (1.676 m), weight 72.8 kg (160 lb 8 oz), SpO2 100 %.  PHYSICAL EXAMINATION:   Physical Exam  GENERAL:  61 y.o.-year-old patient  Sitting up in bed eating Jello confused but in NAD.  EYES: Pupils equal, round, reactive to light. + scleral icterus. Extraocular muscles intact.  HEENT: Head atraumatic, normocephalic. Oropharynx and nasopharynx clear.  Moist Oral Mucosa NECK:  Supple, no jugular venous distention. No thyroid enlargement, no tenderness.  LUNGS: Poor Resp. effort, no wheezing, rales, rhonchi. No use of accessory muscles of respiration.  CARDIOVASCULAR: S1, S2 normal. No murmurs, rubs, or gallops.  ABDOMEN: Soft, nontender, nondistended. Bowel sounds present. No organomegaly or mass.  EXTREMITIES: No cyanosis, clubbing or edema b/l.    NEUROLOGIC: Cranial nerves II through XII are intact. No focal Motor or sensory deficits b/l.    PSYCHIATRIC: AAO X 3.   SKIN: No obvious rash, lesion, or ulcer.    LABORATORY PANEL:   CBC Recent Labs  Lab 08/14/17 0618  WBC 8.6  HGB 9.9*  HCT 29.2*  PLT 101*    ------------------------------------------------------------------------------------------------------------------  Chemistries  Recent Labs  Lab 08/15/17 0457  NA 133*  K 3.6  CL 106  CO2 18*  GLUCOSE 117*  BUN 13  CREATININE 0.73  CALCIUM 8.7*  AST 123*  ALT 61  ALKPHOS 110  BILITOT 4.6*   ------------------------------------------------------------------------------------------------------------------  Cardiac Enzymes No results for input(s): TROPONINI in the last 168 hours. ------------------------------------------------------------------------------------------------------------------  RADIOLOGY:  No results found.   ASSESSMENT AND PLAN:   61 year old male with past medical history of chronic liver disease, liver cirrhosis, history of colon cancer, COPD, diabetes, GERD, hyperlipidemia, neuropathy, alcohol abuse who presents to the hospital due to altered mental status and noted to be encephalopathic.  1. Altered mental status-secondary to hepatic encephalopathy. -Patient's ammonia level was as high as 99 and down to 40 today. Mental status improved since yesterday and close to baseline. -We'll change lactulose to orally, add Xifaxan. Also use when necessary Haldol for agitation/sundowning.  2. Hepatic encephalopathy-this is the cause of patient's altered mental status. Ammonia level 40 today and mental status improve.  -Continue lactulose but switch to oral. Start Xifaxan.  Follow clinically.   3. Diabetes type 2 without complication-continue sliding scale insulin, follow blood sugars.  4. COPD-no acute exacerbation-continue Dulera, albuterol nebulizers as needed.  5. GERD - cont. Protonix.   Possible discharge to skilled nursing facility tomorrow.  All the records are reviewed and case discussed with Care Management/Social Worker. Management plans discussed with the patient, family and they are in agreement.  CODE STATUS: DO NOT RESUSCITATE  DVT  Prophylaxis: Lovenox  TOTAL TIME TAKING CARE OF THIS PATIENT: 95  minutes.   POSSIBLE D/C IN 1-2 DAYS, DEPENDING ON CLINICAL CONDITION.   Henreitta Leber M.D on 08/15/2017 at 12:04 PM  Between 7am to 6pm - Pager - [phone]  After 6pm go to [email or web address] - [place] [facility]  Office  [phone]  CC: Primary care physician; Center, [facility]

## 2017-08-15 NOTE — Progress Notes (Signed)
Son reports pt becoming agitated again, "wanting to leave." and requested med for agitation. Haldol given IV as ordered. Comfort measures performed. Pt currently resting quietly with eyes closed with son leaving and stated, "do what you have to".

## 2017-08-16 LAB — COMPREHENSIVE METABOLIC PANEL
ALBUMIN: 2.1 g/dL — AB (ref 3.5–5.0)
ALK PHOS: 92 U/L (ref 38–126)
ALT: 51 U/L (ref 17–63)
ANION GAP: 4 — AB (ref 5–15)
AST: 96 U/L — AB (ref 15–41)
BUN: 10 mg/dL (ref 6–20)
CALCIUM: 8.1 mg/dL — AB (ref 8.9–10.3)
CO2: 22 mmol/L (ref 22–32)
CREATININE: 0.66 mg/dL (ref 0.61–1.24)
Chloride: 107 mmol/L (ref 101–111)
GFR calc Af Amer: >60 mL/min (ref >60)
GFR calc non Af Amer: >60 mL/min (ref >60)
GLUCOSE: 104 mg/dL — AB (ref 65–99)
Potassium: 3 mmol/L — ABNORMAL LOW (ref 3.5–5.1)
SODIUM: 133 mmol/L — AB (ref 135–145)
Total Bilirubin: 3.6 mg/dL — ABNORMAL HIGH (ref 0.3–1.2)
Total Protein: 5.6 g/dL — ABNORMAL LOW (ref 6.5–8.1)

## 2017-08-16 LAB — GLUCOSE, CAPILLARY
GLUCOSE-CAPILLARY: 144 mg/dL — AB (ref 65–99)
Glucose-Capillary: 99 mg/dL (ref 65–99)
Glucose-Capillary: 98 mg/dL (ref 65–99)

## 2017-08-16 LAB — AMMONIA: Ammonia: 30 umol/L (ref 9–35)

## 2017-08-16 MED ORDER — LACTULOSE 10 GM/15ML PO SOLN
30.0000 g | Freq: Three times a day (TID) | ORAL | 0 refills | Status: DC
Start: 2017-08-16 — End: 2017-11-16
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
## 2017-08-16 NOTE — Progress Notes (Signed)
Patient is medically stable for D/C back to [name] today. Per [facility] admissions coordinator at [facility] patient can return today. RN will call report and arrange EMS for transport. RN has agreed to contact patient's son Hilliard Clark when EMS arrives. Clinical Education officer, museum (CSW) sent D/C orders to [name] via [place]. Patient is aware of above. Patient's son/ legal guardian Hilliard Clark is at bedside and aware of above. Please reconsult if future social work needs arise. CSW signing off.   [employer], LCSW [phone]

## 2017-08-16 NOTE — Progress Notes (Signed)
Called at gave report to Johnney Ou and [place] health care

## 2017-10-22 ENCOUNTER — Other Ambulatory Visit
Admission: RE | Admit: 2017-10-22 | Discharge: 2017-10-22 | Disposition: A | Discharge status: Home / Self Care | Payer: Medicaid Other | Source: Other Acute Inpatient Hospital | Attending: Physician Assistant | Admitting: Physician Assistant

## 2017-10-22 DIAGNOSIS — Z5181 Encounter for therapeutic drug level monitoring: Secondary | ICD-10-CM | POA: Diagnosis not present

## 2017-10-22 LAB — CBC
HCT: 19.3 % — ABNORMAL LOW (ref 40.0–52.0)
Hemoglobin: 6.6 g/dL — ABNORMAL LOW (ref 13.0–18.0)
MCH: 34 pg (ref 26.0–34.0)
MCHC: 34.2 g/dL (ref 32.0–36.0)
MCV: 99.3 fL (ref 80.0–100.0)
PLATELETS: 127 10*3/uL — AB (ref 150–440)
RBC: 1.94 MIL/uL — ABNORMAL LOW (ref 4.40–5.90)
RDW: 17.9 % — AB (ref 11.5–14.5)
WBC: 7 10*3/uL (ref 3.8–10.6)

## 2017-10-27 ENCOUNTER — Other Ambulatory Visit: Payer: Self-pay | Admitting: Physician Assistant

## 2017-10-27 ENCOUNTER — Ambulatory Visit
Admission: RE | Admit: 2017-10-27 | Discharge: 2017-10-27 | Disposition: A | Discharge status: Home / Self Care | Payer: Medicaid Other | Source: Ambulatory Visit | Attending: Physician Assistant | Admitting: Physician Assistant

## 2017-10-27 DIAGNOSIS — R188 Other ascites: Secondary | ICD-10-CM | POA: Diagnosis present

## 2017-10-27 LAB — ABO/RH: ABO/RH(D): O POS

## 2017-10-27 LAB — PREPARE RBC (CROSSMATCH)

## 2017-10-27 LAB — HEMOGLOBIN: Hemoglobin: 7.3 g/dL — ABNORMAL LOW (ref 13.0–18.0)

## 2017-10-28 LAB — BPAM RBC
BLOOD PRODUCT EXPIRATION DATE: 201903272359
ISSUE DATE / TIME: 201903060928
UNIT TYPE AND RH: 5100

## 2017-10-28 LAB — TYPE AND SCREEN
ABO/RH(D): O POS
Antibody Screen: NEGATIVE
UNIT DIVISION: 0

## 2017-11-15 ENCOUNTER — Encounter: Payer: Self-pay | Admitting: Emergency Medicine

## 2017-11-15 ENCOUNTER — Inpatient Hospital Stay
Admission: EM | Admit: 2017-11-15 | Discharge: 2017-11-16 | DRG: 082 | Disposition: A | Discharge status: Hospice | Payer: Medicaid Other | Attending: Internal Medicine | Admitting: Internal Medicine

## 2017-11-15 ENCOUNTER — Other Ambulatory Visit: Payer: Self-pay

## 2017-11-15 ENCOUNTER — Emergency Department: Payer: Medicaid Other

## 2017-11-15 DIAGNOSIS — F1010 Alcohol abuse, uncomplicated: Secondary | ICD-10-CM | POA: Diagnosis present

## 2017-11-15 DIAGNOSIS — E785 Hyperlipidemia, unspecified: Secondary | ICD-10-CM | POA: Diagnosis present

## 2017-11-15 DIAGNOSIS — Z66 Do not resuscitate: Secondary | ICD-10-CM | POA: Diagnosis present

## 2017-11-15 DIAGNOSIS — R402113 Coma scale, eyes open, never, at hospital admission: Secondary | ICD-10-CM | POA: Diagnosis present

## 2017-11-15 DIAGNOSIS — R402213 Coma scale, best verbal response, none, at hospital admission: Secondary | ICD-10-CM | POA: Diagnosis present

## 2017-11-15 DIAGNOSIS — E861 Hypovolemia: Secondary | ICD-10-CM | POA: Diagnosis present

## 2017-11-15 DIAGNOSIS — S06369A Traumatic hemorrhage of cerebrum, unspecified, with loss of consciousness of unspecified duration, initial encounter: Secondary | ICD-10-CM

## 2017-11-15 DIAGNOSIS — Z888 Allergy status to other drugs, medicaments and biological substances status: Secondary | ICD-10-CM

## 2017-11-15 DIAGNOSIS — Z515 Encounter for palliative care: Secondary | ICD-10-CM | POA: Diagnosis present

## 2017-11-15 DIAGNOSIS — K7682 Hepatic encephalopathy: Secondary | ICD-10-CM

## 2017-11-15 DIAGNOSIS — R0902 Hypoxemia: Secondary | ICD-10-CM

## 2017-11-15 DIAGNOSIS — M109 Gout, unspecified: Secondary | ICD-10-CM | POA: Diagnosis present

## 2017-11-15 DIAGNOSIS — K7030 Alcoholic cirrhosis of liver without ascites: Secondary | ICD-10-CM | POA: Diagnosis present

## 2017-11-15 DIAGNOSIS — K219 Gastro-esophageal reflux disease without esophagitis: Secondary | ICD-10-CM | POA: Diagnosis present

## 2017-11-15 DIAGNOSIS — E871 Hypo-osmolality and hyponatremia: Secondary | ICD-10-CM | POA: Diagnosis present

## 2017-11-15 DIAGNOSIS — D638 Anemia in other chronic diseases classified elsewhere: Secondary | ICD-10-CM | POA: Diagnosis present

## 2017-11-15 DIAGNOSIS — D696 Thrombocytopenia, unspecified: Secondary | ICD-10-CM | POA: Diagnosis present

## 2017-11-15 DIAGNOSIS — Z6824 Body mass index (BMI) 24.0-24.9, adult: Secondary | ICD-10-CM

## 2017-11-15 DIAGNOSIS — W19XXXA Unspecified fall, initial encounter: Secondary | ICD-10-CM | POA: Diagnosis present

## 2017-11-15 DIAGNOSIS — E1142 Type 2 diabetes mellitus with diabetic polyneuropathy: Secondary | ICD-10-CM | POA: Diagnosis present

## 2017-11-15 DIAGNOSIS — I629 Nontraumatic intracranial hemorrhage, unspecified: Secondary | ICD-10-CM

## 2017-11-15 DIAGNOSIS — S06386A Contusion, laceration, and hemorrhage of brainstem with loss of consciousness greater than 24 hours without return to pre-existing conscious level with patient surviving, initial encounter: Secondary | ICD-10-CM | POA: Diagnosis not present

## 2017-11-15 DIAGNOSIS — Z85828 Personal history of other malignant neoplasm of skin: Secondary | ICD-10-CM

## 2017-11-15 DIAGNOSIS — K7200 Acute and subacute hepatic failure without coma: Secondary | ICD-10-CM | POA: Diagnosis present

## 2017-11-15 DIAGNOSIS — J440 Chronic obstructive pulmonary disease with acute lower respiratory infection: Secondary | ICD-10-CM | POA: Diagnosis present

## 2017-11-15 DIAGNOSIS — Z87891 Personal history of nicotine dependence: Secondary | ICD-10-CM

## 2017-11-15 DIAGNOSIS — I509 Heart failure, unspecified: Secondary | ICD-10-CM | POA: Diagnosis present

## 2017-11-15 DIAGNOSIS — J9811 Atelectasis: Secondary | ICD-10-CM | POA: Diagnosis present

## 2017-11-15 DIAGNOSIS — Z79899 Other long term (current) drug therapy: Secondary | ICD-10-CM

## 2017-11-15 DIAGNOSIS — F419 Anxiety disorder, unspecified: Secondary | ICD-10-CM | POA: Diagnosis present

## 2017-11-15 DIAGNOSIS — R402313 Coma scale, best motor response, none, at hospital admission: Secondary | ICD-10-CM | POA: Diagnosis present

## 2017-11-15 DIAGNOSIS — E876 Hypokalemia: Secondary | ICD-10-CM | POA: Diagnosis present

## 2017-11-15 DIAGNOSIS — E43 Unspecified severe protein-calorie malnutrition: Secondary | ICD-10-CM | POA: Diagnosis present

## 2017-11-15 DIAGNOSIS — J189 Pneumonia, unspecified organism: Secondary | ICD-10-CM | POA: Diagnosis present

## 2017-11-15 DIAGNOSIS — Z85038 Personal history of other malignant neoplasm of large intestine: Secondary | ICD-10-CM

## 2017-11-15 DIAGNOSIS — Y92129 Unspecified place in nursing home as the place of occurrence of the external cause: Secondary | ICD-10-CM | POA: Diagnosis not present

## 2017-11-15 DIAGNOSIS — R059 Cough, unspecified: Secondary | ICD-10-CM

## 2017-11-15 DIAGNOSIS — K7290 Hepatic failure, unspecified without coma: Secondary | ICD-10-CM

## 2017-11-15 DIAGNOSIS — R05 Cough: Secondary | ICD-10-CM

## 2017-11-15 DIAGNOSIS — J9601 Acute respiratory failure with hypoxia: Secondary | ICD-10-CM | POA: Diagnosis present

## 2017-11-15 DIAGNOSIS — G4700 Insomnia, unspecified: Secondary | ICD-10-CM | POA: Diagnosis present

## 2017-11-15 LAB — COMPREHENSIVE METABOLIC PANEL
ALT: 61 U/L (ref 17–63)
ANION GAP: 10 (ref 5–15)
AST: 126 U/L — ABNORMAL HIGH (ref 15–41)
Albumin: 1.7 g/dL — ABNORMAL LOW (ref 3.5–5.0)
Alkaline Phosphatase: 101 U/L (ref 38–126)
BILIRUBIN TOTAL: 4.2 mg/dL — AB (ref 0.3–1.2)
BUN: 19 mg/dL (ref 6–20)
CO2: 21 mmol/L — ABNORMAL LOW (ref 22–32)
Calcium: 7.5 mg/dL — ABNORMAL LOW (ref 8.9–10.3)
Chloride: 98 mmol/L — ABNORMAL LOW (ref 101–111)
Creatinine, Ser: 1.26 mg/dL — ABNORMAL HIGH (ref 0.61–1.24)
GFR calc Af Amer: >60 mL/min (ref >60)
GFR, EST NON AFRICAN AMERICAN: 59 mL/min — AB (ref >60)
Glucose, Bld: 95 mg/dL (ref 65–99)
Potassium: 3.3 mmol/L — ABNORMAL LOW (ref 3.5–5.1)
Sodium: 129 mmol/L — ABNORMAL LOW (ref 135–145)
TOTAL PROTEIN: 4.7 g/dL — AB (ref 6.5–8.1)

## 2017-11-15 LAB — MRSA PCR SCREENING: MRSA by PCR: NEGATIVE

## 2017-11-15 LAB — URINALYSIS, COMPLETE (UACMP) WITH MICROSCOPIC
BACTERIA UA: NONE SEEN
Bilirubin Urine: NEGATIVE
Glucose, UA: NEGATIVE mg/dL
Ketones, ur: NEGATIVE mg/dL
Leukocytes, UA: NEGATIVE
Nitrite: NEGATIVE
Protein, ur: NEGATIVE mg/dL
SPECIFIC GRAVITY, URINE: 1.009 (ref 1.005–1.030)
pH: 6 (ref 5.0–8.0)

## 2017-11-15 LAB — CBC
HEMATOCRIT: 19.5 % — AB (ref 40.0–52.0)
Hemoglobin: 6.6 g/dL — ABNORMAL LOW (ref 13.0–18.0)
MCH: 33.4 pg (ref 26.0–34.0)
MCHC: 33.9 g/dL (ref 32.0–36.0)
MCV: 98.7 fL (ref 80.0–100.0)
PLATELETS: 94 10*3/uL — AB (ref 150–440)
RBC: 1.98 MIL/uL — ABNORMAL LOW (ref 4.40–5.90)
RDW: 20.2 % — AB (ref 11.5–14.5)
WBC: 4 10*3/uL (ref 3.8–10.6)

## 2017-11-15 LAB — PROTIME-INR
INR: 2.03
PROTHROMBIN TIME: 22.8 s — AB (ref 11.4–15.2)

## 2017-11-15 LAB — AMMONIA: AMMONIA: 128 umol/L — AB (ref 9–35)

## 2017-11-15 LAB — LACTIC ACID, PLASMA: LACTIC ACID, VENOUS: 1.7 mmol/L (ref 0.5–1.9)

## 2017-11-15 LAB — APTT: APTT: 41 s — AB (ref 24–36)

## 2017-11-15 LAB — GLUCOSE, CAPILLARY: Glucose-Capillary: 98 mg/dL (ref 65–99)

## 2017-11-15 MED ORDER — LACTULOSE 10 GM/15ML PO SOLN
ORAL | Status: AC
  Filled 2017-11-15: qty 60

## 2017-11-15 MED ORDER — ACETAMINOPHEN 325 MG PO TABS: 650.0000 mg | ORAL_TABLET | Freq: Four times a day (QID) | ORAL | Status: DC | PRN

## 2017-11-15 MED ORDER — LACTULOSE 10 GM/15ML PO SOLN
40.0000 g | Freq: Once | ORAL | Status: AC
  Administered 2017-11-15: 40 g

## 2017-11-15 MED ORDER — ONDANSETRON HCL 4 MG/2ML IJ SOLN: 4.0000 mg | Freq: Four times a day (QID) | INTRAMUSCULAR | Status: DC | PRN

## 2017-11-15 MED ORDER — SODIUM CHLORIDE 0.9% FLUSH
3.0000 mL | Freq: Two times a day (BID) | INTRAVENOUS | Status: DC
  Administered 2017-11-15 – 2017-11-16 (×2): 3 mL via INTRAVENOUS

## 2017-11-15 MED ORDER — MORPHINE SULFATE (PF) 4 MG/ML IV SOLN
4.0000 mg | INTRAVENOUS | Status: DC | PRN
  Administered 2017-11-15 – 2017-11-16 (×3): 4 mg via INTRAVENOUS
  Filled 2017-11-15 (×4): qty 1

## 2017-11-15 MED ORDER — SODIUM CHLORIDE 0.9% FLUSH: 3.0000 mL | INTRAVENOUS | Status: DC | PRN

## 2017-11-15 MED ORDER — LORAZEPAM 1 MG PO TABS: 1.0000 mg | ORAL_TABLET | ORAL | Status: DC | PRN

## 2017-11-15 MED ORDER — LORAZEPAM 2 MG/ML IJ SOLN
1.0000 mg | INTRAMUSCULAR | Status: DC | PRN
  Administered 2017-11-15 – 2017-11-16 (×3): 1 mg via INTRAVENOUS
  Filled 2017-11-15 (×3): qty 1

## 2017-11-15 MED ORDER — ONDANSETRON 4 MG PO TBDP
4.0000 mg | ORAL_TABLET | Freq: Four times a day (QID) | ORAL | Status: DC | PRN
  Filled 2017-11-15: qty 1

## 2017-11-15 MED ORDER — LORAZEPAM 2 MG/ML PO CONC
1.0000 mg | ORAL | Status: DC | PRN
Start: 2017-11-15 — End: 2017-11-16

## 2017-11-15 MED ORDER — ACETAMINOPHEN 650 MG RE SUPP: 650.0000 mg | Freq: Four times a day (QID) | RECTAL | Status: DC | PRN

## 2017-11-15 MED ORDER — ATROPINE SULFATE 1 % OP SOLN
4.0000 [drp] | OPHTHALMIC | Status: DC | PRN
  Administered 2017-11-16: 10:00:00 4 [drp] via SUBLINGUAL
  Filled 2017-11-15: qty 2

## 2017-11-15 MED ORDER — ORAL CARE MOUTH RINSE
15.0000 mL | Freq: Two times a day (BID) | OROMUCOSAL | Status: DC
  Administered 2017-11-15 – 2017-11-16 (×2): 15 mL via OROMUCOSAL

## 2017-11-15 MED ORDER — SODIUM CHLORIDE 0.9 % IV SOLN: 250.0000 mL | INTRAVENOUS | Status: DC | PRN

## 2017-11-15 NOTE — Progress Notes (Signed)
Follow up note on new hospice home referral received today from [facility]. Patient is a 62 year old man with a known history multiple medical problems including of liver cirrhosis, COPD and CHF. He was sent to the [facility] ED today from  [facility] care for evaluation of unresponsiveness. Per chart note review patient did have a fall yesterday with no changes, in the ED today he was found on CT to have an intraventricular hemorrhage in the fourth ventricle and an ammonia level of 128 as well as a complete opacification of his left lung. Attending physician Dr. Darvin Neighbours spoke with patient's family, they have chosen to focus on comfort  And would like for patient to be transferred to the hospice home. Writer met in the family room with patient's son and Leary Roca to initiate education regarding hospice services, philosophy and team approach to care with good understanding voiced. Questions answered, consents signed. Patient information faxed to referral. Patient seen lying in bed, eyes open, but not responding verbally, some what restless, resisted oxygen when attempted to be placed by Probation officer. Education given to family regarding oxygen use at the end of life. Family voiced understanding. Staff RN Maddy in during visit to administer PRN morphine. Plan is for transfer to the Hospice home via EMS tomorrow. Signed DNR in place in patient's chart.  Hospital care team updated. Flo Shanks RN, BSN, [facility], hospital liaison [phone] ,

## 2017-11-15 NOTE — Clinical Social Work Note (Addendum)
CSW received consult for "[facility]." CSW met with patient's family at bedside. CSW confirmed family wanting full comfort measures and hospice home placement. CSW provided hospice home listing and son/guardian-Shawn Pietrzak chose [facility]. CSW informed family that CSW to see if bed is available, but if not patient will be admitted to hospital.   CSW called and updated [place] liaison at [phone]. Santiago Glad unsure of hospice bed availability at this time, but will find out and call CSW back. Santiago Glad to also meet with family. CSW updated Dr. Cherylann Banas, Dr. Darvin Neighbours, and RN Rush [facility]mark.   12:01pm - CSW followed up with [facility] liaison, who stated still unsure of hospice bed availability and will update CSW as soon as she is made aware.  12:46pm - CSW received a call from Flo Shanks stating no hospice bed available until tomorrow Tuesday 3/26. CSW updated Dr. Darvin Neighbours, who stated patient would be admitted to 1C today. CSW updated RN [facility] thorugh ED Secretary Lattie Haw. CSW to complete assessment and handoff to 1C unit CSW. CSW continuing to follow for transitions of care/discharge needs.   Oretha Ellis, Latanya Presser, [place] Social Worker-ED [phone]

## 2017-11-15 NOTE — ED Notes (Signed)
Pt opening eyes at this time but still unable to communicate.

## 2017-11-15 NOTE — ED Notes (Signed)
venti mask changed to Reserve @ 4L to keep sats 90% or higher per Dr. Darvin Neighbours.
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
## 2017-11-15 NOTE — Clinical Social Work Note (Signed)
Clinical Social Work Assessment  Patient Details  Name: Gregory Marquez MRN: [id number] Date of Birth: Jan 18, 1956  Date of referral:  11/15/17               Reason for consult:  End of Life/Hospice, Discharge Planning                Permission sought to share information with:  Facility Sport and exercise psychologist, Family Supports Permission granted to share information::  Yes, Verbal Permission Granted(Per Acie Fredrickson)  Name::     Jamarkis Branam  Agency::  Hospice  Relationship::  Son/Guardian  Contact Information:  [phone]  Housing/Transportation Living arrangements for the past 2 months:  [place]) Source of Information:  Guardian Patient Interpreter Needed:  None Criminal Activity/Legal Involvement Pertinent to Current Situation/Hospitalization:  No - Comment as needed Significant Relationships:  Other Family Members, Adult Children Lives with:  Facility Resident Do you feel safe going back to the place where you live?  Yes Need for family participation in patient care:  Yes (Comment)  Care giving concerns:  No care giving concerns identified.    Social Worker assessment / plan:  CSW met with family this am and documented the following: "CSW received consult for "[facility]." CSW met with patient's family at bedside. CSW confirmed family wanting full comfort measures and hospice home placement. CSW provided hospice home listing and son/guardian-Shawn Granieri [facility]. CSW informed family that CSW to see if bed is available, but if not patient will be admitted to hospital.   CSW called and updated [facility] liaison at [phone]. Santiago Glad unsure of hospice bed availability at this time, but will find out and call CSW back. Santiago Glad to also meet with family. CSW updated Dr. Cherylann Banas, Dr. Darvin Neighbours, and RN Rush Landmark.   12:01pm - CSW followed up with [facility]  liaison, who stated still unsure of hospice bed availability and will update CSW as soon as she is made aware.  12:46pm - CSW received a call from Flo Shanks stating no hospice bed available until tomorrow Tuesday 3/26. CSW updated Dr. Darvin Neighbours, who stated patient would be admitted to 1C today. CSW updated RN Rush Landmark thorugh ED Secretary Lattie Haw. CSW to complete assessment and handoff to 1C unit CSW. CSW continuing to follow for transitions of care/discharge needs."   Employment status:  Disabled (Comment on whether or not currently receiving Disability) Insurance information:  [place]icaid [place] PT Recommendations:  Not assessed at this time Information / Referral to community resources:  Other (Comment Required)(Residential Hospice)  Patient/Family's Response to care: Patient is unresponsive. Family agreeable to full comfort measures and hospice placement.   Patient/Family's Understanding of and Emotional Response to Diagnosis, Current Treatment, and Prognosis:  Family aware of patient medical condition and supportive of patient care needs. Family emotional while at bedside.   Emotional Assessment Appearance:  Appears stated age Attitude/Demeanor/Rapport:  Unresponsive Affect (typically observed):  Unable to Assess Orientation:    Alcohol / Substance use:  Other Psych involvement (Current and /or in the community):  No (Comment)  Discharge Needs  Concerns to be addressed:  Care Coordination Readmission within the last 30 days:  No Current discharge risk:  Terminally ill Barriers to Discharge:  Hospice Bed not available   Truitt Merle, LCSW 11/15/2017, 2:28 PM

## 2017-11-15 NOTE — Progress Notes (Signed)
Hospice home referral received from [facility]. Alena Bills made aware there are currently no beds available. Writer to meet with family. Plan is for transfer to the hospice home tomorrow 3/26. Hospital care team aware. Flo Shanks RN, BSN, [facility], hospital Liaison [phone]

## 2017-11-15 NOTE — ED Notes (Signed)
Pt's legal guardian Jaylin Benzel son contacted [phone]. MD Owens Shark given phone in order to update son on current status of pt.

## 2017-11-15 NOTE — ED Notes (Signed)
Per Dr. Owens Shark, pt has yellow DNR and as such code status is DNR.
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
## 2017-11-15 NOTE — ED Triage Notes (Addendum)
Pt arrives via [facility] with c/o AMS. Pt was LKW at 0000. Pt is unresponsive to painful stimulus at this time. Per facility, pt had a fall yesterday and was not sent to ED. Pt has a bruised area of left forehead. Per EMS, pt was 86% RA upon arrival to scene with other VS WDL. Pt has hx of liver failure and is yellow at this time.

## 2017-11-15 NOTE — Progress Notes (Addendum)
*   ICH- Intraventricular bleed * Right lung white out likely collapse * Acute hepatic encephalopathy * Cirrhosis * Acute hypoxic resp failure  Discussed with son and daughter. Do not want any aggressive measures. Dicussed comfort measures.  Hospice home if bed available or admit. Life expectancy < 1 week

## 2017-11-15 NOTE — Plan of Care (Signed)
  Problem: Education: Goal: Knowledge of the prescribed therapeutic regimen will improve Outcome: Progressing   Problem: Coping: Goal: Ability to identify and develop effective coping behavior will improve Outcome: Progressing   Problem: Respiratory: Goal: Verbalizations of increased ease of respirations will increase Outcome: Progressing   Problem: Role Relationship: Goal: Family's ability to cope with current situation will improve Outcome: Progressing   Problem: Education: Goal: Knowledge of General Education information will improve Outcome: Progressing   Problem: Safety: Goal: Ability to remain free from injury will improve Outcome: Progressing

## 2017-11-16 MED ORDER — GLYCOPYRROLATE 0.2 MG/ML IJ SOLN
0.1000 mg | INTRAMUSCULAR | Status: DC
  Filled 2017-11-16: qty 0.5

## 2017-11-16 MED ORDER — GLYCOPYRROLATE 0.2 MG/ML IJ SOLN: 0.1000 mg | Freq: Once | INTRAMUSCULAR | Status: DC

## 2017-11-16 NOTE — Progress Notes (Signed)
Follow up visit made to new hospice home referral. Patient seen lying in bed, eyes closed, audible secretions noted. Addressed with staff RN Freddie Breech and attending physician Dr.Sudini. Patient's ex-wife Helene Kelp present at end of visit, education given regarding secretions and management. Helene Kelp and hospital care team also advised that EMS had been notified for transport, she will contact [place] patient's son, who is at work. Report called to the hospice home. Discharge summary faxed to referral.  Flo Shanks RN, BSN, [facility], [facility] [phone]

## 2017-11-16 NOTE — Plan of Care (Signed)
  Problem: Education: Goal: Knowledge of the prescribed therapeutic regimen will improve Outcome: Adequate for Discharge   Problem: Coping: Goal: Ability to identify and develop effective coping behavior will improve Outcome: Adequate for Discharge   Problem: Clinical Measurements: Goal: Quality of life will improve Outcome: Adequate for Discharge   Problem: Respiratory: Goal: Verbalizations of increased ease of respirations will increase Outcome: Adequate for Discharge   Problem: Role Relationship: Goal: Family's ability to cope with current situation will improve Outcome: Adequate for Discharge Goal: Ability to verbalize concerns, feelings, and thoughts to partner or family member will improve Outcome: Adequate for Discharge   Problem: Role Relationship: Goal: Family's ability to cope with current situation will improve Outcome: Adequate for Discharge Goal: Ability to verbalize concerns, feelings, and thoughts to partner or family member will improve Outcome: Adequate for Discharge   Problem: Pain Management: Goal: Satisfaction with pain management regimen will improve Outcome: Adequate for Discharge   Problem: Education: Goal: Knowledge of General Education information will improve Outcome: Adequate for Discharge   Problem: Safety: Goal: Ability to remain free from injury will improve Outcome: Adequate for Discharge

## 2017-11-16 NOTE — Progress Notes (Signed)
Per [facility] liaison patient will transfer to the hospice facility today. Clinical Education officer, museum (CSW) prepared EMS form including DNR and guardianship paper work. MD aware of above. Please reconsult if future social work needs arise. CSW signing off.   [employer], LCSW [phone]
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
## 2017-11-16 NOTE — Progress Notes (Signed)
Pt left via ems at this time for the hospice home. Medication given earlier.  For secretions / restlessness and discomfort. vss

## 2017-11-16 NOTE — Progress Notes (Signed)
Nutrition Brief Note  Patient identified to be seen for low Braden score. Patient known to this RD from several previous admissions. Chart reviewed. Patient has transitioned to comfort care and will be discharging to hospice home.   No nutrition interventions warranted at this time.  Please consult RD as needed.   Willey Blade, MS, [employer], LDN Office: [phone] Pager: [phone] After Hours/Weekend Pager: [phone]

## 2017-11-20 LAB — CULTURE, BLOOD (ROUTINE X 2)
Culture: NO GROWTH
SPECIAL REQUESTS: ADEQUATE

## 2017-11-22 DEATH — deceased
